# Patient Record
Sex: Female | Born: 1965
Health system: Southern US, Community
[De-identification: ages and names within clinical notes are randomized; demographics above are authoritative.]

## PROBLEM LIST (undated history)

## (undated) DIAGNOSIS — F329 Major depressive disorder, single episode, unspecified: Secondary | ICD-10-CM

## (undated) DIAGNOSIS — K219 Gastro-esophageal reflux disease without esophagitis: Secondary | ICD-10-CM

## (undated) DIAGNOSIS — E785 Hyperlipidemia, unspecified: Secondary | ICD-10-CM

## (undated) DIAGNOSIS — F32A Depression, unspecified: Secondary | ICD-10-CM

## (undated) DIAGNOSIS — I10 Essential (primary) hypertension: Secondary | ICD-10-CM

## (undated) HISTORY — DX: Gastro-esophageal reflux disease without esophagitis: K21.9

## (undated) HISTORY — DX: Depression, unspecified: F32.A

## (undated) HISTORY — PX: BREAST BIOPSY: SHX20

## (undated) HISTORY — DX: Essential (primary) hypertension: I10

## (undated) HISTORY — PX: ESOPHAGOGASTRODUODENOSCOPY: SHX1529

## (undated) HISTORY — DX: Major depressive disorder, single episode, unspecified: F32.9

## (undated) HISTORY — DX: Hyperlipidemia, unspecified: E78.5

---

## 1997-08-03 ENCOUNTER — Encounter: Admission: RE | Admit: 1997-08-03 | Discharge: 1997-08-03 | Payer: Self-pay | Admitting: *Deleted

## 1998-04-03 ENCOUNTER — Emergency Department (HOSPITAL_COMMUNITY): Admission: EM | Admit: 1998-04-03 | Discharge: 1998-04-03 | Payer: Self-pay | Admitting: Emergency Medicine

## 1998-04-03 ENCOUNTER — Encounter: Payer: Self-pay | Admitting: Emergency Medicine

## 1999-11-15 ENCOUNTER — Encounter: Payer: Self-pay | Admitting: Oral Surgery

## 1999-11-15 ENCOUNTER — Ambulatory Visit (HOSPITAL_COMMUNITY): Admission: RE | Admit: 1999-11-15 | Discharge: 1999-11-15 | Payer: Self-pay | Admitting: Oral Surgery

## 2001-08-25 ENCOUNTER — Emergency Department (HOSPITAL_COMMUNITY): Admission: EM | Admit: 2001-08-25 | Discharge: 2001-08-25 | Payer: Self-pay | Admitting: Emergency Medicine

## 2003-03-05 HISTORY — PX: ABDOMINAL HYSTERECTOMY: SHX81

## 2004-01-31 ENCOUNTER — Encounter: Admission: RE | Admit: 2004-01-31 | Discharge: 2004-01-31 | Payer: Self-pay | Admitting: Family Medicine

## 2004-02-07 ENCOUNTER — Other Ambulatory Visit: Admission: RE | Admit: 2004-02-07 | Discharge: 2004-02-07 | Payer: Self-pay | Admitting: *Deleted

## 2004-10-26 ENCOUNTER — Emergency Department: Payer: Self-pay | Admitting: Emergency Medicine

## 2004-11-29 ENCOUNTER — Ambulatory Visit (HOSPITAL_COMMUNITY): Admission: RE | Admit: 2004-11-29 | Discharge: 2004-11-29 | Payer: Self-pay | Admitting: Gastroenterology

## 2005-10-16 ENCOUNTER — Ambulatory Visit: Payer: Self-pay | Admitting: Family Medicine

## 2005-12-30 IMAGING — US US PELVIS COMPLETE MODIFY
1 series · 14 of 25 positions shown · non-contrast
Comparison: none

CLINICAL DATA: Pelvic pain.  Menorrhagia. 
 ULTRASOUND PELVIS ? TRANSABDOMINAL AND TRANSVAGINAL:

[Series 1: unknown · 0.25mm/px · 14 of 61 slices shown]
[im 1/61]
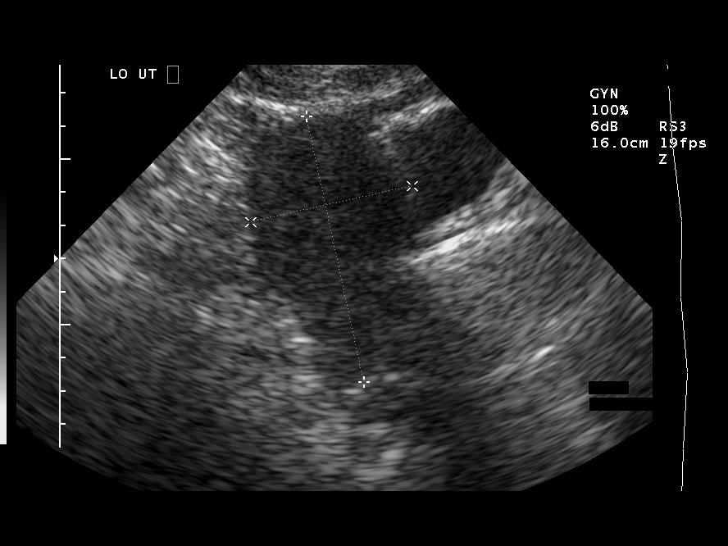
[im 6/61]
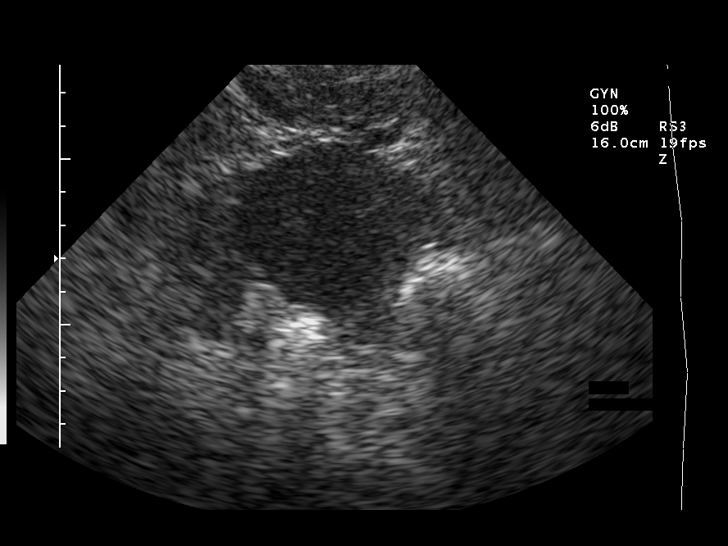
[im 11/61]
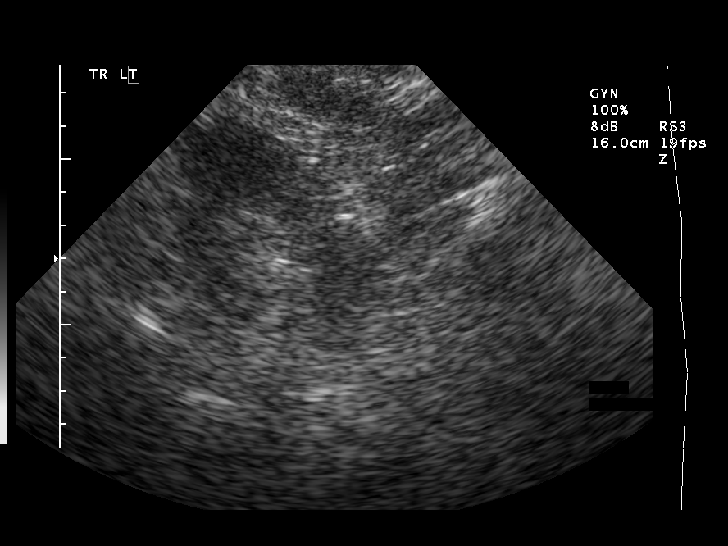
[im 16/61]
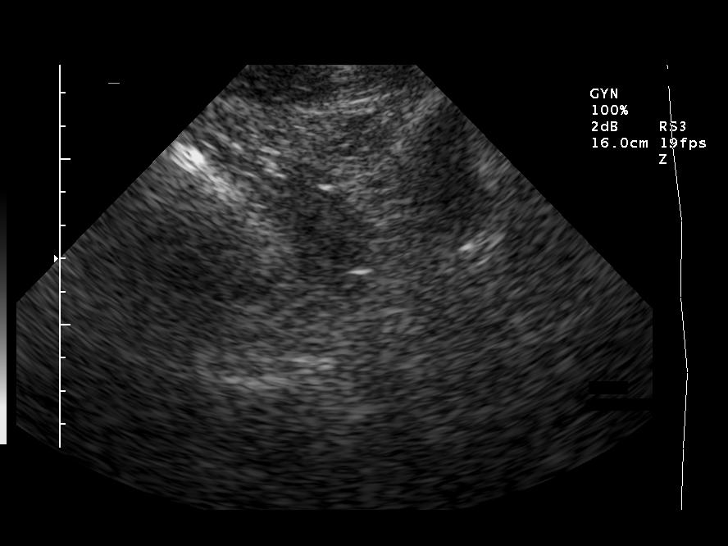
[im 21/61]
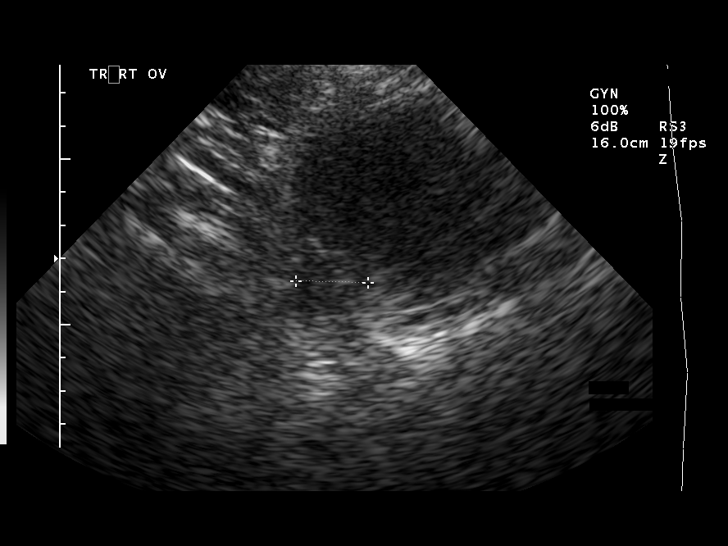
[im 23/61]
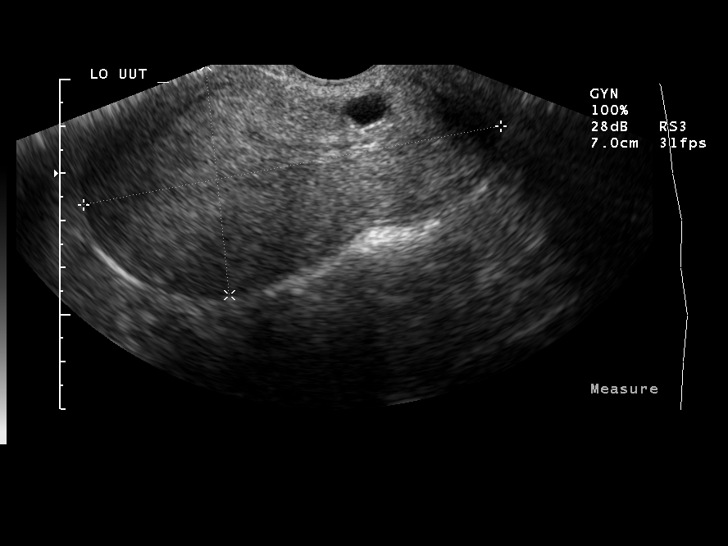
[im 28/61]
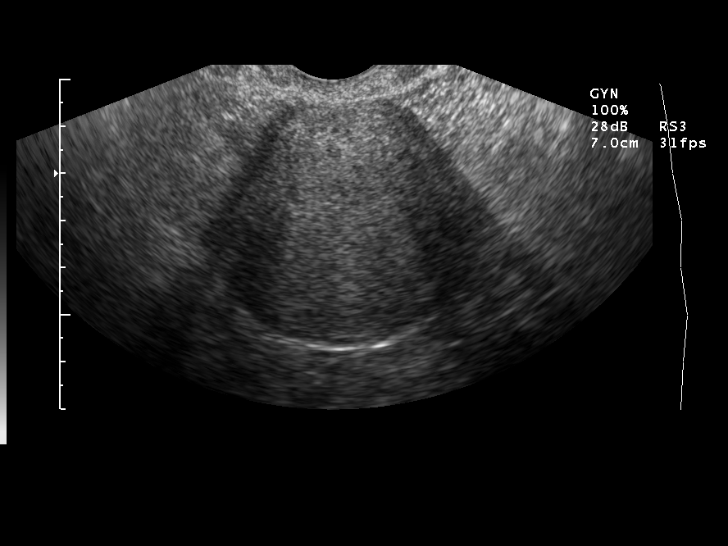
[im 33/61]
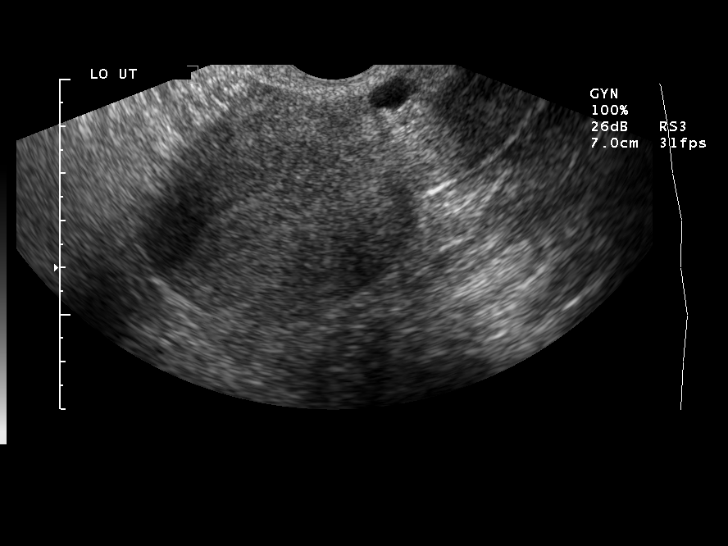
[im 38/61]
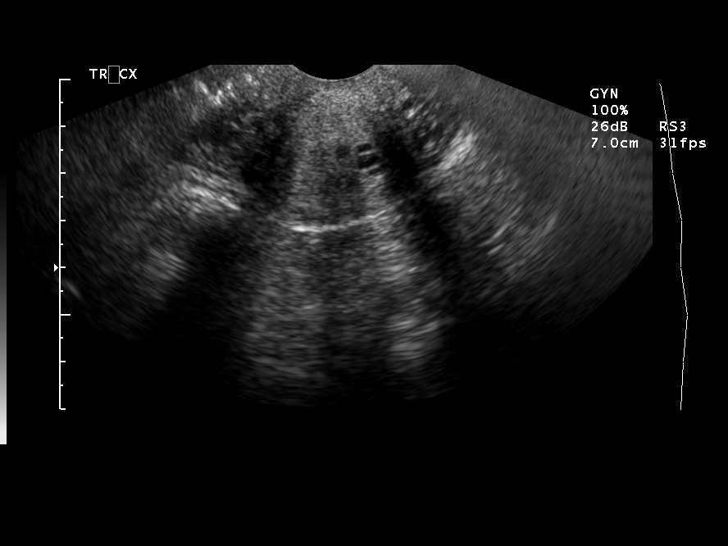
[im 41/61]
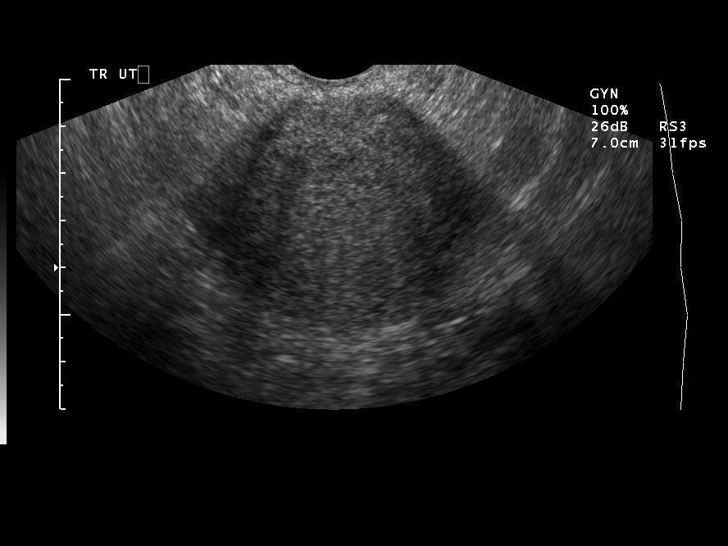
[im 46/61]
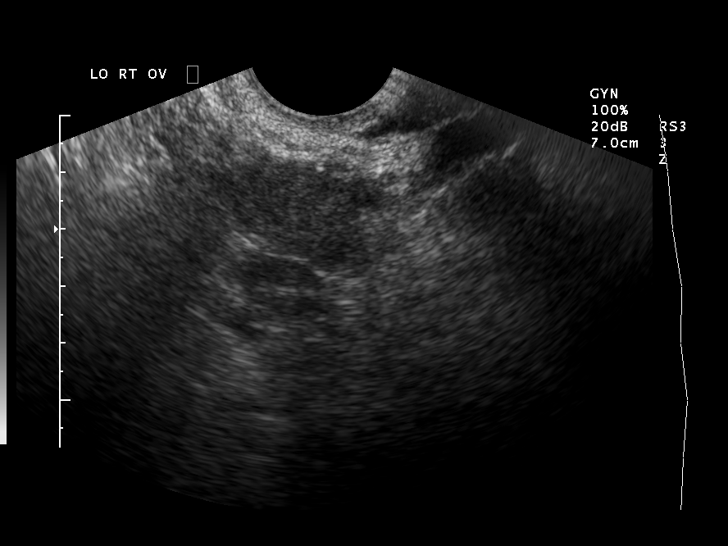
[im 51/61]
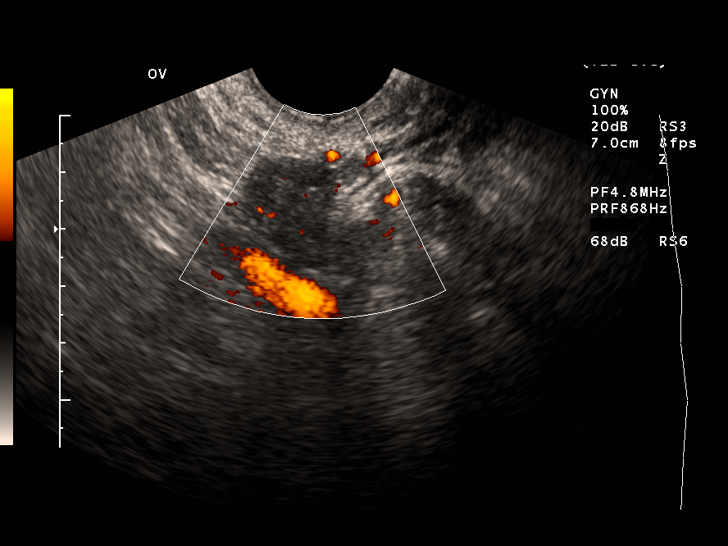
[im 56/61]
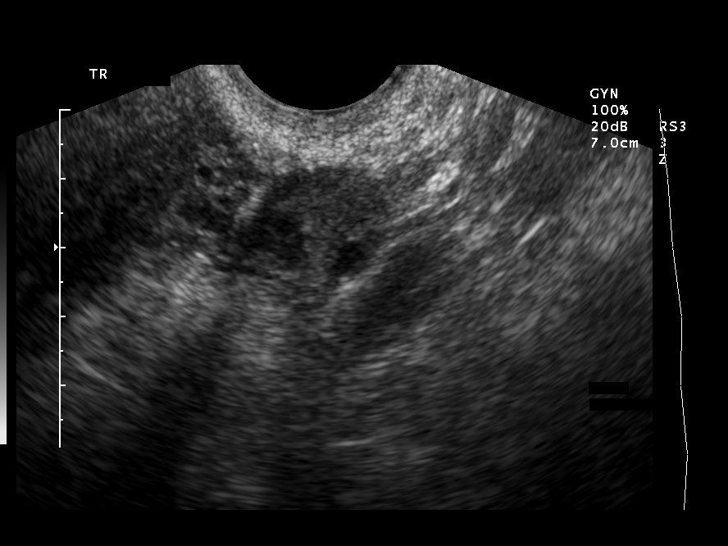
[im 61/61]
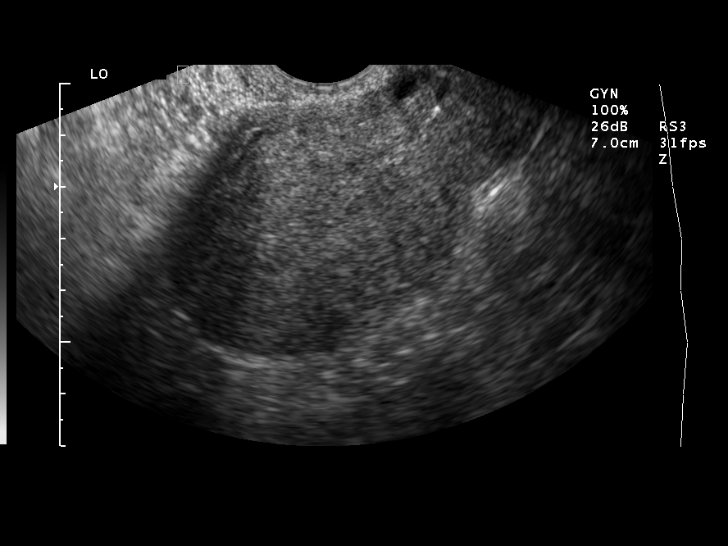

[14 of 25 positions shown; findings below may reference images not displayed]

FINDINGS: The uterus is normal in size measuring 9.4 cm long x 5 cm AP x 6 cm wide with normal fundal endometrial stripe thickness of 12 mm.  The myometrium is slightly heterogeneous although no discrete lesions are seen.  No free fluid is noted.  The bilateral ovaries are sonographically normal with the right measuring 3 cm long x 1.7 cm AP x 2.1 cm wide.  The left 2.5 cm long x 1.8 cm AP x 1.8 cm wide.
IMPRESSION: 1.  Slightly heterogeneous uterine myometrium which could represent slight diffuse fibroid changes.
 2.  Otherwise normal.

## 2006-06-06 ENCOUNTER — Emergency Department (HOSPITAL_COMMUNITY): Admission: EM | Admit: 2006-06-06 | Discharge: 2006-06-06 | Payer: Self-pay | Admitting: Emergency Medicine

## 2006-07-02 ENCOUNTER — Ambulatory Visit: Payer: Self-pay | Admitting: Family Medicine

## 2006-07-02 ENCOUNTER — Other Ambulatory Visit: Admission: RE | Admit: 2006-07-02 | Discharge: 2006-07-02 | Payer: Self-pay | Admitting: Family Medicine

## 2006-08-19 ENCOUNTER — Encounter: Admission: RE | Admit: 2006-08-19 | Discharge: 2006-08-19 | Payer: Self-pay | Admitting: Family Medicine

## 2006-08-28 ENCOUNTER — Ambulatory Visit: Payer: Self-pay | Admitting: Family Medicine

## 2007-05-05 ENCOUNTER — Encounter: Payer: Self-pay | Admitting: Family Medicine

## 2007-07-15 ENCOUNTER — Ambulatory Visit: Payer: Self-pay | Admitting: Family Medicine

## 2007-07-15 DIAGNOSIS — E785 Hyperlipidemia, unspecified: Secondary | ICD-10-CM | POA: Insufficient documentation

## 2007-07-15 DIAGNOSIS — F329 Major depressive disorder, single episode, unspecified: Secondary | ICD-10-CM | POA: Insufficient documentation

## 2007-07-15 DIAGNOSIS — N951 Menopausal and female climacteric states: Secondary | ICD-10-CM | POA: Insufficient documentation

## 2007-07-15 DIAGNOSIS — I10 Essential (primary) hypertension: Secondary | ICD-10-CM | POA: Insufficient documentation

## 2007-07-15 DIAGNOSIS — K219 Gastro-esophageal reflux disease without esophagitis: Secondary | ICD-10-CM | POA: Insufficient documentation

## 2007-07-22 ENCOUNTER — Encounter: Payer: Self-pay | Admitting: Family Medicine

## 2007-09-01 ENCOUNTER — Ambulatory Visit: Payer: Self-pay | Admitting: Family Medicine

## 2007-11-11 ENCOUNTER — Encounter: Payer: Self-pay | Admitting: Family Medicine

## 2007-11-11 ENCOUNTER — Telehealth: Payer: Self-pay | Admitting: Family Medicine

## 2007-12-23 ENCOUNTER — Ambulatory Visit: Payer: Self-pay | Admitting: Family Medicine

## 2007-12-23 DIAGNOSIS — J209 Acute bronchitis, unspecified: Secondary | ICD-10-CM | POA: Insufficient documentation

## 2007-12-23 DIAGNOSIS — M461 Sacroiliitis, not elsewhere classified: Secondary | ICD-10-CM | POA: Insufficient documentation

## 2007-12-23 LAB — CONVERTED CEMR LAB
Bilirubin Urine: NEGATIVE
Glucose, Urine, Semiquant: NEGATIVE
Ketones, urine, test strip: NEGATIVE
Nitrite: NEGATIVE
Protein, U semiquant: NEGATIVE
Specific Gravity, Urine: 1.02
Urobilinogen, UA: 0.2
pH: 7

## 2008-01-04 ENCOUNTER — Ambulatory Visit: Payer: Self-pay | Admitting: Family Medicine

## 2008-01-04 DIAGNOSIS — K5289 Other specified noninfective gastroenteritis and colitis: Secondary | ICD-10-CM | POA: Insufficient documentation

## 2008-01-05 ENCOUNTER — Telehealth: Payer: Self-pay | Admitting: Family Medicine

## 2008-02-28 ENCOUNTER — Ambulatory Visit (HOSPITAL_COMMUNITY): Admission: RE | Admit: 2008-02-28 | Discharge: 2008-02-28 | Payer: Self-pay | Admitting: Emergency Medicine

## 2008-03-01 ENCOUNTER — Ambulatory Visit: Payer: Self-pay | Admitting: Family Medicine

## 2008-03-01 DIAGNOSIS — R55 Syncope and collapse: Secondary | ICD-10-CM | POA: Insufficient documentation

## 2008-03-03 LAB — CONVERTED CEMR LAB
ALT: 16 units/L (ref 0–35)
AST: 22 units/L (ref 0–37)
Albumin: 3.7 g/dL (ref 3.5–5.2)
Alkaline Phosphatase: 45 units/L (ref 39–117)
BUN: 13 mg/dL (ref 6–23)
Basophils Absolute: 0.1 10*3/uL (ref 0.0–0.1)
Basophils Relative: 2.3 % (ref 0.0–3.0)
Bilirubin, Direct: 0.1 mg/dL (ref 0.0–0.3)
CO2: 30 meq/L (ref 19–32)
Calcium: 9.3 mg/dL (ref 8.4–10.5)
Chloride: 102 meq/L (ref 96–112)
Cholesterol: 218 mg/dL (ref 0–200)
Creatinine, Ser: 0.8 mg/dL (ref 0.4–1.2)
Direct LDL: 140 mg/dL
Eosinophils Absolute: 0.2 10*3/uL (ref 0.0–0.7)
Eosinophils Relative: 3.4 % (ref 0.0–5.0)
GFR calc Af Amer: 100 mL/min
GFR calc non Af Amer: 83 mL/min
Glucose, Bld: 86 mg/dL (ref 70–99)
HCT: 40.4 % (ref 36.0–46.0)
HDL: 54.6 mg/dL (ref >39.0)
Hemoglobin: 14.3 g/dL (ref 12.0–15.0)
Lymphocytes Relative: 46.5 % — ABNORMAL HIGH (ref 12.0–46.0)
MCHC: 35.4 g/dL (ref 30.0–36.0)
MCV: 94 fL (ref 78.0–100.0)
Monocytes Absolute: 0.6 10*3/uL (ref 0.1–1.0)
Monocytes Relative: 10.6 % (ref 3.0–12.0)
Neutro Abs: 2 10*3/uL (ref 1.4–7.7)
Neutrophils Relative %: 37.2 % — ABNORMAL LOW (ref 43.0–77.0)
Platelets: 162 10*3/uL (ref 150–400)
Potassium: 3.3 meq/L — ABNORMAL LOW (ref 3.5–5.1)
RBC: 4.3 M/uL (ref 3.87–5.11)
RDW: 13.7 % (ref 11.5–14.6)
Sodium: 140 meq/L (ref 135–145)
TSH: 1.67 microintl units/mL (ref 0.35–5.50)
Total Bilirubin: 1 mg/dL (ref 0.3–1.2)
Total CHOL/HDL Ratio: 4
Total Protein: 6.5 g/dL (ref 6.0–8.3)
Triglycerides: 121 mg/dL (ref 0–149)
VLDL: 24 mg/dL (ref 0–40)
Vitamin B-12: 952 pg/mL — ABNORMAL HIGH (ref 211–911)
WBC: 5.4 10*3/uL (ref 4.5–10.5)

## 2008-03-07 ENCOUNTER — Ambulatory Visit: Payer: Self-pay | Admitting: Family Medicine

## 2008-03-07 DIAGNOSIS — B9789 Other viral agents as the cause of diseases classified elsewhere: Secondary | ICD-10-CM | POA: Insufficient documentation

## 2008-03-07 LAB — CONVERTED CEMR LAB
Bilirubin Urine: NEGATIVE
Glucose, Urine, Semiquant: NEGATIVE
Ketones, urine, test strip: NEGATIVE
Nitrite: NEGATIVE
Protein, U semiquant: NEGATIVE
Specific Gravity, Urine: 1.01
Urobilinogen, UA: 0.2
WBC Urine, dipstick: NEGATIVE
pH: 6.5

## 2008-03-14 LAB — CONVERTED CEMR LAB
CMV IgM: <8 (ref <30.0)
Cytomegalovirus Ab-IgG: 8.6 — ABNORMAL HIGH (ref <0.4)
EBV NA IgG: 7.21 — ABNORMAL HIGH
EBV VCA IgG: 9.92 — ABNORMAL HIGH
EBV VCA IgM: 0.27

## 2008-07-27 ENCOUNTER — Ambulatory Visit: Payer: Self-pay | Admitting: Family Medicine

## 2008-08-03 LAB — CONVERTED CEMR LAB
ALT: 16 units/L (ref 0–35)
AST: 22 units/L (ref 0–37)
Albumin: 3.8 g/dL (ref 3.5–5.2)
Alkaline Phosphatase: 59 units/L (ref 39–117)
BUN: 9 mg/dL (ref 6–23)
Bilirubin, Direct: 0.1 mg/dL (ref 0.0–0.3)
CO2: 30 meq/L (ref 19–32)
Calcium: 8.7 mg/dL (ref 8.4–10.5)
Chloride: 110 meq/L (ref 96–112)
Cholesterol: 221 mg/dL — ABNORMAL HIGH (ref 0–200)
Creatinine, Ser: 0.7 mg/dL (ref 0.4–1.2)
Direct LDL: 162.5 mg/dL
GFR calc non Af Amer: 116.56 mL/min (ref >60)
Glucose, Bld: 87 mg/dL (ref 70–99)
HDL: 55.1 mg/dL (ref >39.00)
Potassium: 3.9 meq/L (ref 3.5–5.1)
Sodium: 143 meq/L (ref 135–145)
Total Bilirubin: 1 mg/dL (ref 0.3–1.2)
Total CHOL/HDL Ratio: 4
Total Protein: 6.6 g/dL (ref 6.0–8.3)
Triglycerides: 76 mg/dL (ref 0.0–149.0)
VLDL: 15.2 mg/dL (ref 0.0–40.0)

## 2008-08-04 ENCOUNTER — Telehealth: Payer: Self-pay | Admitting: Family Medicine

## 2008-09-01 ENCOUNTER — Telehealth: Payer: Self-pay | Admitting: Family Medicine

## 2008-11-30 ENCOUNTER — Ambulatory Visit: Payer: Self-pay | Admitting: Family Medicine

## 2008-12-01 ENCOUNTER — Telehealth: Payer: Self-pay | Admitting: Family Medicine

## 2008-12-16 ENCOUNTER — Telehealth: Payer: Self-pay | Admitting: Family Medicine

## 2009-05-16 ENCOUNTER — Ambulatory Visit: Payer: Self-pay | Admitting: Family Medicine

## 2009-05-19 ENCOUNTER — Telehealth: Payer: Self-pay | Admitting: Family Medicine

## 2009-07-13 ENCOUNTER — Telehealth: Payer: Self-pay | Admitting: Family Medicine

## 2009-07-14 ENCOUNTER — Ambulatory Visit: Payer: Self-pay | Admitting: Family Medicine

## 2009-07-14 DIAGNOSIS — R1013 Epigastric pain: Secondary | ICD-10-CM | POA: Insufficient documentation

## 2009-07-19 ENCOUNTER — Telehealth: Payer: Self-pay | Admitting: Family Medicine

## 2009-07-24 ENCOUNTER — Telehealth: Payer: Self-pay | Admitting: Family Medicine

## 2009-07-25 ENCOUNTER — Telehealth: Payer: Self-pay | Admitting: Gastroenterology

## 2009-07-25 ENCOUNTER — Encounter (INDEPENDENT_AMBULATORY_CARE_PROVIDER_SITE_OTHER): Payer: Self-pay | Admitting: *Deleted

## 2009-07-25 ENCOUNTER — Ambulatory Visit: Payer: Self-pay | Admitting: Gastroenterology

## 2009-07-25 DIAGNOSIS — R112 Nausea with vomiting, unspecified: Secondary | ICD-10-CM | POA: Insufficient documentation

## 2009-07-25 DIAGNOSIS — F17200 Nicotine dependence, unspecified, uncomplicated: Secondary | ICD-10-CM | POA: Insufficient documentation

## 2009-08-02 ENCOUNTER — Telehealth: Payer: Self-pay | Admitting: Gastroenterology

## 2009-08-02 ENCOUNTER — Ambulatory Visit: Payer: Self-pay | Admitting: Gastroenterology

## 2009-08-02 LAB — CONVERTED CEMR LAB: UREASE: NEGATIVE

## 2009-08-03 ENCOUNTER — Telehealth: Payer: Self-pay | Admitting: Family Medicine

## 2009-08-04 ENCOUNTER — Ambulatory Visit (HOSPITAL_COMMUNITY): Admission: RE | Admit: 2009-08-04 | Discharge: 2009-08-04 | Payer: Self-pay | Admitting: Gastroenterology

## 2009-08-04 ENCOUNTER — Encounter: Payer: Self-pay | Admitting: Gastroenterology

## 2009-08-04 LAB — CONVERTED CEMR LAB
ALT: 17 units/L (ref 0–35)
AST: 18 units/L (ref 0–37)
Albumin: 3.7 g/dL (ref 3.5–5.2)
Alkaline Phosphatase: 46 units/L (ref 39–117)
Amylase: 138 units/L — ABNORMAL HIGH (ref 27–131)
BUN: 12 mg/dL (ref 6–23)
Basophils Absolute: 0.2 10*3/uL — ABNORMAL HIGH (ref 0.0–0.1)
Basophils Relative: 3.2 % — ABNORMAL HIGH (ref 0.0–3.0)
Bilirubin, Direct: 0.1 mg/dL (ref 0.0–0.3)
CO2: 27 meq/L (ref 19–32)
Calcium: 9 mg/dL (ref 8.4–10.5)
Chloride: 108 meq/L (ref 96–112)
Creatinine, Ser: 0.7 mg/dL (ref 0.4–1.2)
Eosinophils Absolute: 0.1 10*3/uL (ref 0.0–0.7)
Eosinophils Relative: 2.1 % (ref 0.0–5.0)
Ferritin: 87.2 ng/mL (ref 10.0–291.0)
Folate: 5.8 ng/mL
GFR calc non Af Amer: 110.56 mL/min (ref >60)
Glucose, Bld: 92 mg/dL (ref 70–99)
HCT: 42.4 % (ref 36.0–46.0)
Hemoglobin: 14.7 g/dL (ref 12.0–15.0)
Iron: 128 ug/dL (ref 42–145)
Lipase: 25 units/L (ref 11.0–59.0)
Lymphocytes Relative: 46.7 % — ABNORMAL HIGH (ref 12.0–46.0)
Lymphs Abs: 2.6 10*3/uL (ref 0.7–4.0)
MCHC: 34.6 g/dL (ref 30.0–36.0)
MCV: 93.2 fL (ref 78.0–100.0)
Monocytes Absolute: 0.6 10*3/uL (ref 0.1–1.0)
Monocytes Relative: 11.4 % (ref 3.0–12.0)
Neutro Abs: 2 10*3/uL (ref 1.4–7.7)
Neutrophils Relative %: 36.6 % — ABNORMAL LOW (ref 43.0–77.0)
Platelets: 168 10*3/uL (ref 150.0–400.0)
Potassium: 4 meq/L (ref 3.5–5.1)
RBC: 4.55 M/uL (ref 3.87–5.11)
RDW: 14.8 % — ABNORMAL HIGH (ref 11.5–14.6)
Saturation Ratios: 43.1 % (ref 20.0–50.0)
Sodium: 140 meq/L (ref 135–145)
TSH: 1.98 microintl units/mL (ref 0.35–5.50)
Total Bilirubin: 0.6 mg/dL (ref 0.3–1.2)
Total Protein: 6.4 g/dL (ref 6.0–8.3)
Transferrin: 212.2 mg/dL (ref 212.0–360.0)
Vitamin B-12: 404 pg/mL (ref 211–911)
WBC: 5.6 10*3/uL (ref 4.5–10.5)

## 2009-08-21 ENCOUNTER — Ambulatory Visit: Payer: Self-pay | Admitting: Gastroenterology

## 2009-08-29 ENCOUNTER — Ambulatory Visit: Payer: Self-pay | Admitting: Family Medicine

## 2009-11-13 ENCOUNTER — Telehealth: Payer: Self-pay | Admitting: Gastroenterology

## 2009-11-15 ENCOUNTER — Encounter (INDEPENDENT_AMBULATORY_CARE_PROVIDER_SITE_OTHER): Payer: Self-pay | Admitting: *Deleted

## 2010-01-27 IMAGING — CT CT HEAD W/O CM
1 series · 16 of 30 positions shown, 20 images · non-contrast
Comparison: None.

CLINICAL DATA: Syncopal episode 3 days ago, striking the head.
Continued headaches.

CT HEAD WITHOUT CONTRAST 02/28/2008:
TECHNIQUE: Contiguous axial images were obtained from the base of
the skull through the vertex without contrast.

[Series 2: headseq 4.8 h45s · axial · 0.43mm/px · z∈[+1208,+1338]mm · 16 of 30 slices shown, 20 images]
[im 2/30  brain]
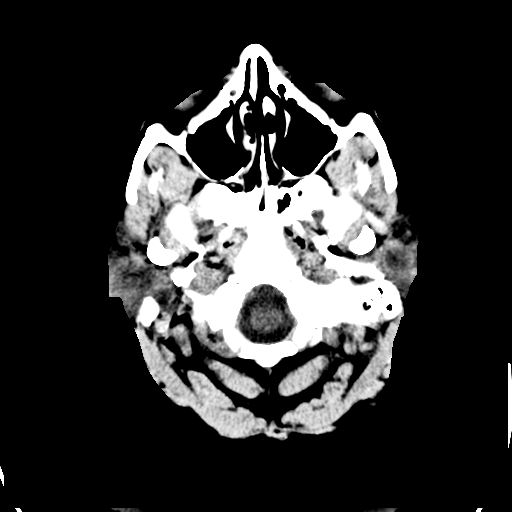
[im 2/30  bone]
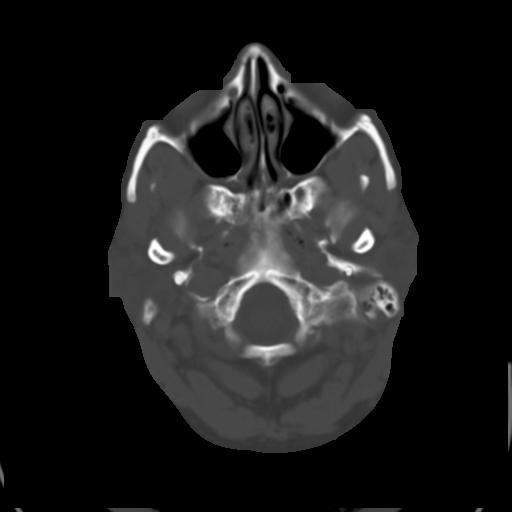
[im 4/30  brain]
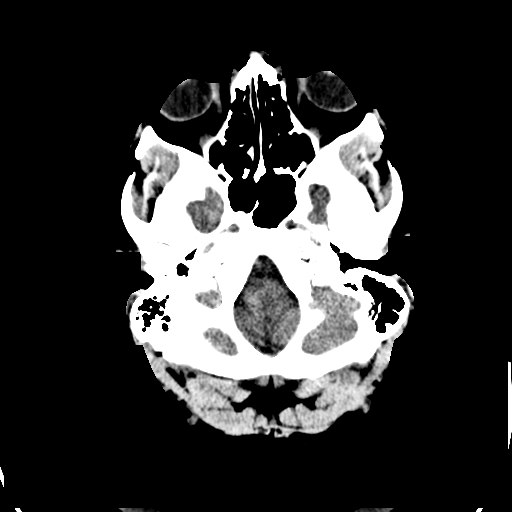
[im 6/30  brain]
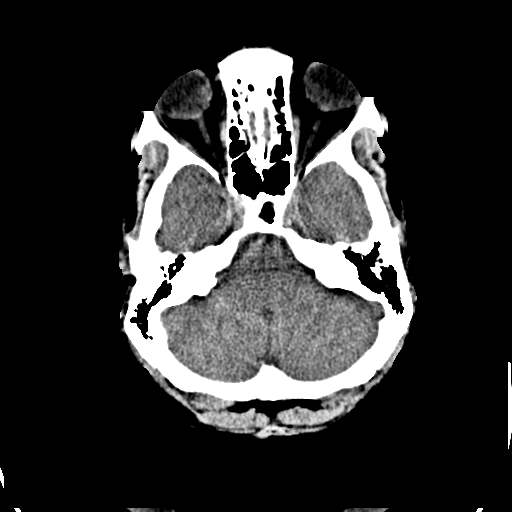
[im 8/30  brain]
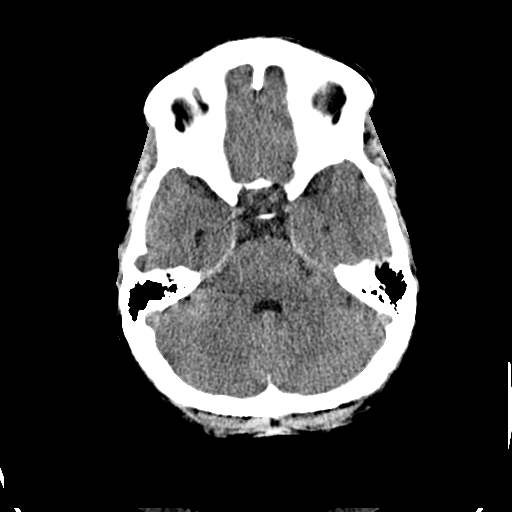
[im 9/30  brain]
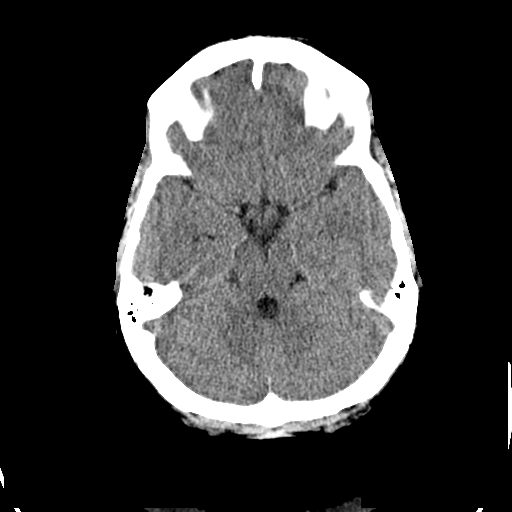
[im 9/30  bone]
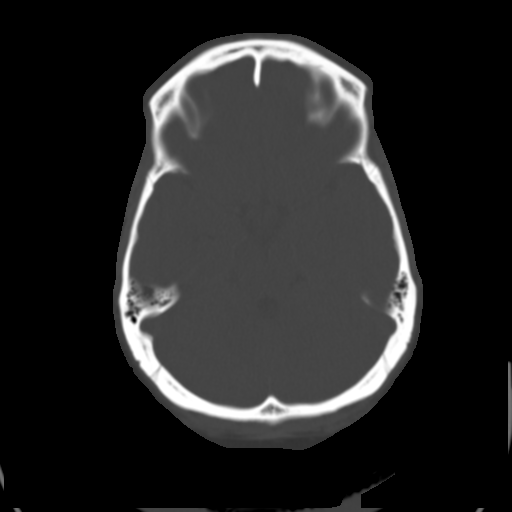
[im 11/30  brain]
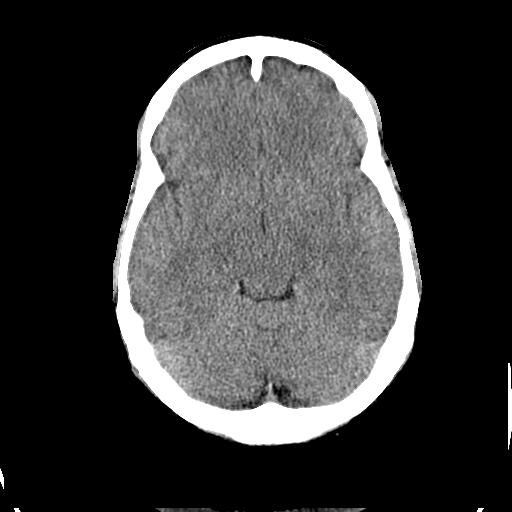
[im 13/30  brain]
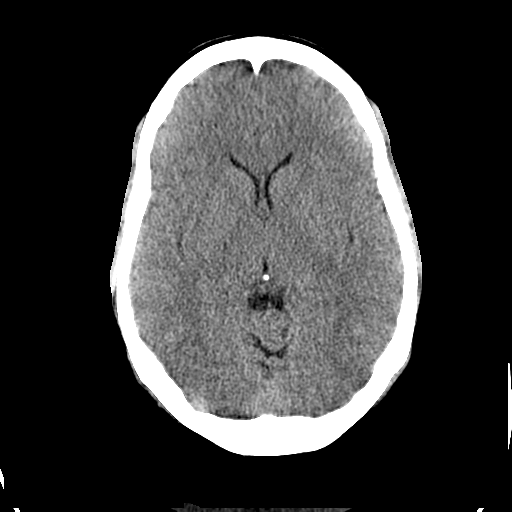
[im 15/30  brain]
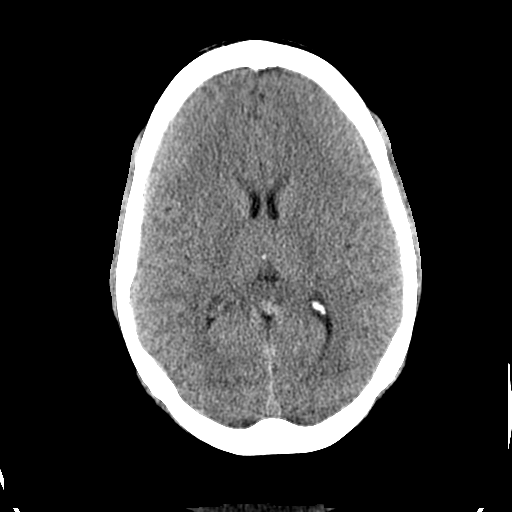
[im 16/30  brain]
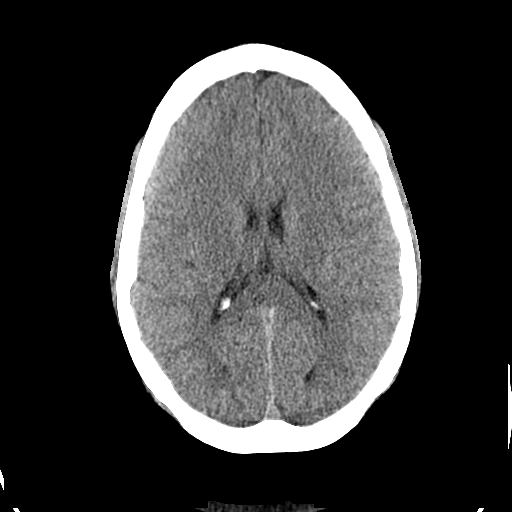
[im 16/30  bone]
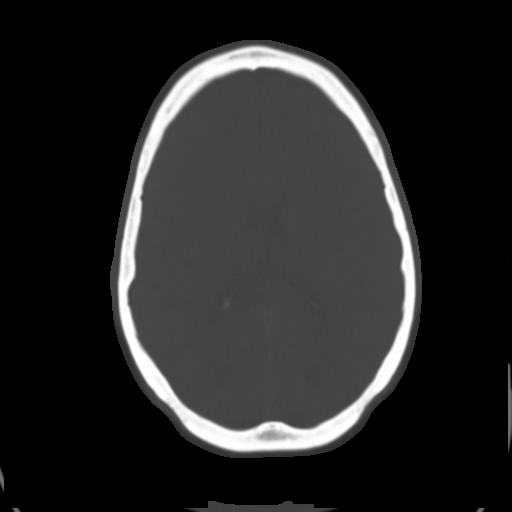
[im 18/30  brain]
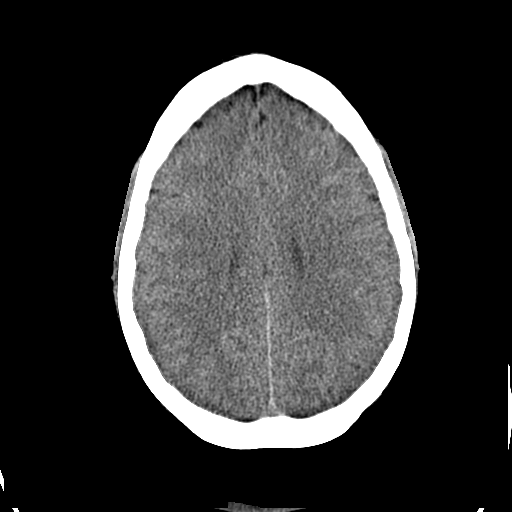
[im 20/30  brain]
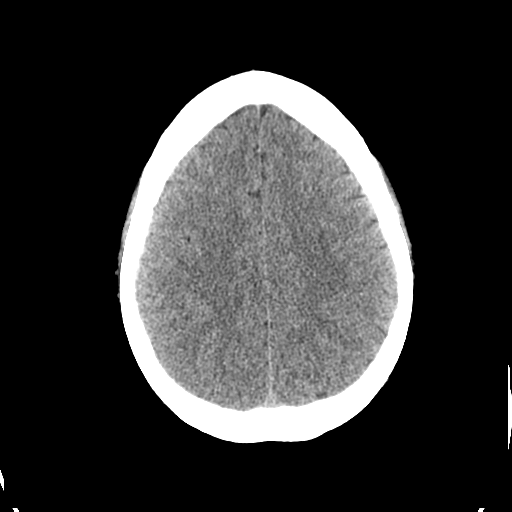
[im 22/30  brain]
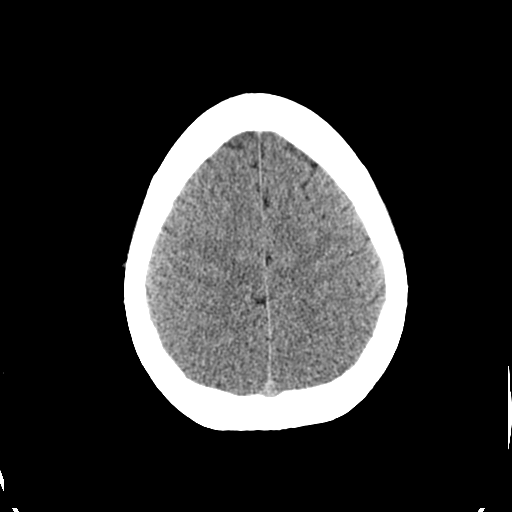
[im 23/30  brain]
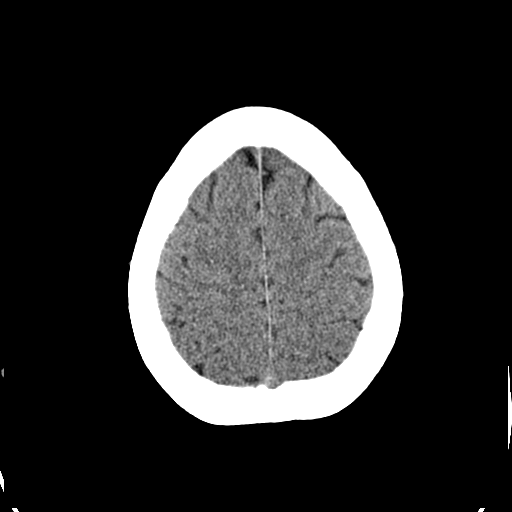
[im 23/30  bone]
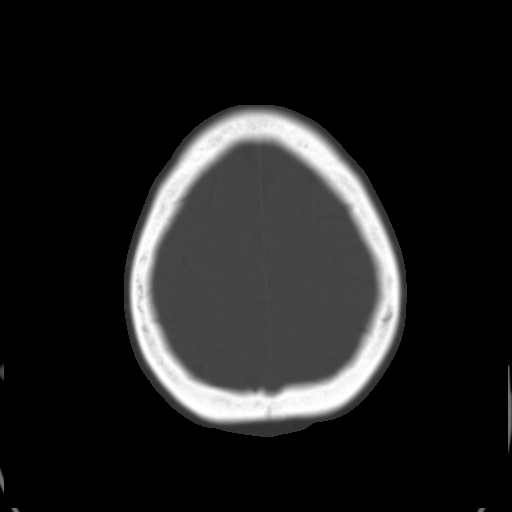
[im 25/30  brain]
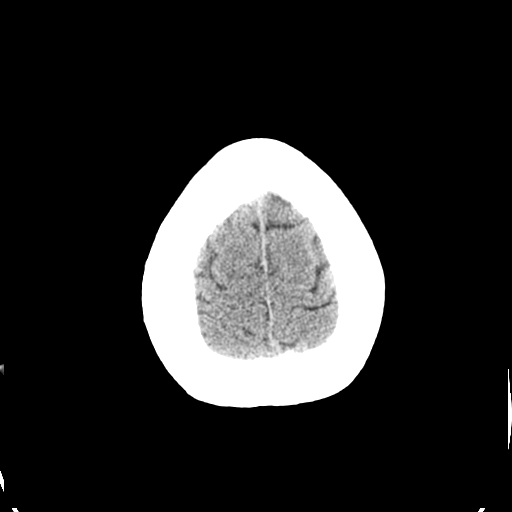
[im 27/30  brain]
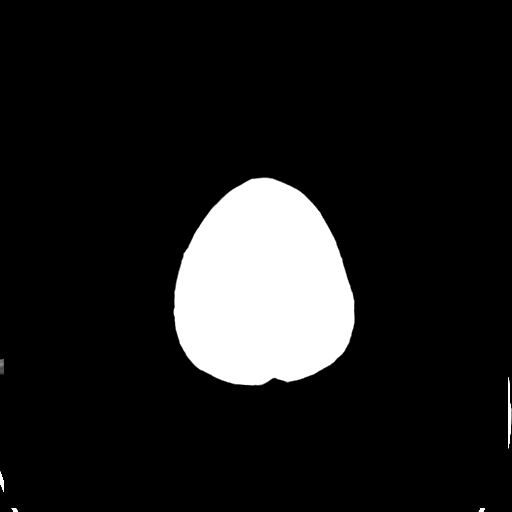
[im 29/30  brain]
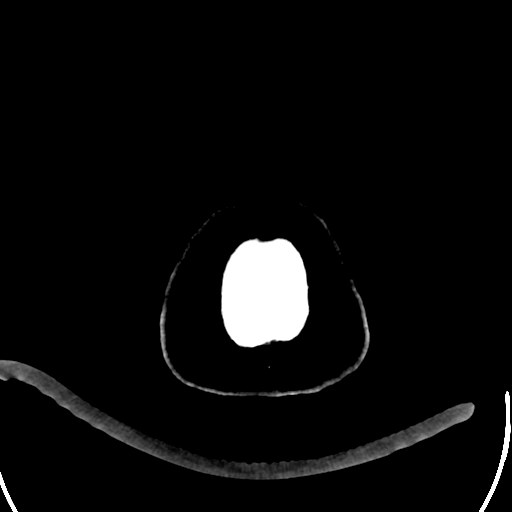

[16 of 30 positions shown; findings below may reference images not displayed]

FINDINGS: Ventricular system normal in size and appearance for age.
No mass lesion.  No midline shift.  No acute hemorrhage or
hematoma.  No extra-axial fluid collections.  No evidence of acute
infarction.  No focal brain parenchymal abnormalities.

No skull fractures or other focal osseous abnormalities involving
the skull.  Visualized paranasal sinuses, mastoid air cells, and
middle ear cavities well-aerated.
IMPRESSION: Normal unenhanced cranial CT.

## 2010-02-08 ENCOUNTER — Ambulatory Visit: Payer: Self-pay | Admitting: Internal Medicine

## 2010-02-08 DIAGNOSIS — M549 Dorsalgia, unspecified: Secondary | ICD-10-CM | POA: Insufficient documentation

## 2010-02-08 DIAGNOSIS — Z87448 Personal history of other diseases of urinary system: Secondary | ICD-10-CM | POA: Insufficient documentation

## 2010-02-08 DIAGNOSIS — M545 Low back pain, unspecified: Secondary | ICD-10-CM | POA: Insufficient documentation

## 2010-02-08 LAB — CONVERTED CEMR LAB
Bilirubin Urine: NEGATIVE
Glucose, Urine, Semiquant: NEGATIVE
Ketones, urine, test strip: NEGATIVE
Nitrite: NEGATIVE
Specific Gravity, Urine: 1.025
Urobilinogen, UA: 0.2
WBC Urine, dipstick: NEGATIVE
pH: 5

## 2010-04-03 ENCOUNTER — Ambulatory Visit
Admission: RE | Admit: 2010-04-03 | Discharge: 2010-04-03 | Payer: Self-pay | Source: Home / Self Care | Attending: Family Medicine | Admitting: Family Medicine

## 2010-04-03 DIAGNOSIS — N318 Other neuromuscular dysfunction of bladder: Secondary | ICD-10-CM | POA: Insufficient documentation

## 2010-04-03 NOTE — Letter (Signed)
Summary: medical records  medical records   Imported By: Kassie Mends 07/22/2007 09:08:18  _____________________________________________________________________  External Attachment:    Type:   Image     Comment:   medical records

## 2010-04-03 NOTE — Progress Notes (Signed)
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  Pt requesting Wed also excused from work, returning on Thursday. Please fax to [phone], inform pt when fax has been completed please Initial call taken by: Sid Falcon LPN,  January 05, 2008 3:14 PM  Follow-up for Phone Call        note faxed, pt aware Follow-up by: Alfred Levins, CMA,  January 05, 2008 5:23 PM

## 2010-04-03 NOTE — Progress Notes (Signed)
  Phone Note Call from Patient   Caller: Patient Call For: Nelwyn Salisbury MD Summary of Call: Pt called said she left Arline Asp a message early this am. Her sinuses have been bothering her x 3 days. She has sinus headaches coming on, ears feel stopped up, coughing up mucus. Not sleeping at night because the drainage from her throat is making her sick. She feels dehydrated. She does not have money to come in. [facility] [address] pt's number [phone] Initial call taken by: Army Fossa CMA,  August 04, 2008 8:10 AM  Follow-up for Phone Call        call in a Zpack. Add Mucinex D two times a day . Follow-up by: Nelwyn Salisbury MD,  August 04, 2008 8:38 AM  Additional Follow-up for Phone Call Additional follow up Details #1::        Phone Call Completed, Rx Called In Additional Follow-up by: Alfred Levins, CMA,  August 04, 2008 12:23 PM    New/Updated Medications: ZITHROMAX Z-PAK 250 MG  TABS (AZITHROMYCIN) Use as directed   Prescriptions: ZITHROMAX Z-PAK 250 MG  TABS (AZITHROMYCIN) Use as directed  #1 x 0   Entered by:   Alfred Levins, CMA   Authorized by:   Nelwyn Salisbury MD   Signed by:   Alfred Levins, CMA on 08/04/2008   Method used:   [facility]  [address] [phone]* (retail)       [address]       [place], [place]  [address]       Ph: [phone]       Fax: [phone]   RxID:   [id number]

## 2010-04-03 NOTE — Progress Notes (Signed)
Summary: Lab reminder  ---- Converted from flag ---- ---- 08/21/2009 11:23 AM, Ashok Cordia RN wrote: Call pt and remind her needs to come by for stool check for h-pylori. ------------------------------  Phone Note Outgoing Call Call back at [facility] Phone [phone]   Call placed by: Harlow Mares CMA Duncan Dull),  November 13, 2009 8:33 AM Call placed to: Patient Summary of Call: Left a message on patients machine to call back.  Initial call taken by: Harlow Mares CMA Duncan Dull),  November 13, 2009 8:33 AM  Follow-up for Phone Call        Left a message on patients machine to call back.  Follow-up by: Harlow Mares CMA Duncan Dull),  November 14, 2009 1:54 PM  Additional Follow-up for Phone Call Additional follow up Details #1::        Left a message on patients machine to call back. I will send the patient a reminder letter.  Additional Follow-up by: Harlow Mares CMA Duncan Dull),  November 15, 2009 12:26 PM

## 2010-04-03 NOTE — Progress Notes (Signed)
Summary: Ultrasound of abd  Phone Note Outgoing Call   Call placed by: Ashok Cordia RN,  August 02, 2009 4:24 PM Summary of Call: Pt needs [place] abd scheduled per Dr. Jarold Motto.  Initial call taken by: Ashok Cordia RN,  August 02, 2009 4:24 PM  Follow-up for Phone Call        Appt sch for 08/04/09 Wl at 8 AM.  Instructions given to pt's daughter Morrie Sheldon. Follow-up by: Ashok Cordia RN,  August 02, 2009 4:31 PM
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
## 2010-04-03 NOTE — Assessment & Plan Note (Signed)
Summary: UTI/dm   Vital Signs:  Patient profile:   45 year old female Weight:      169 pounds Temp:     98.1 degrees F oral BP sitting:   140 / 90  (right arm) Cuff size:   regular  Vitals Entered By: Duard Brady LPN (February 08, 2010 10:53 AM) CC: c/o low back pain ,??uti Is Patient Diabetic? No   Primary Care Provider:  [name], MD  CC:  c/o low back pain  and ??uti.  History of Present Illness: 45 year old patient who is seen today.  Component of the low back pain also describes some mild suprapubic discomfort, urgency, and urinary frequency.  She states she voided 15 times yesterday.  She describes pain in lumbar, area that's fairly constant, with very little in the way of aggravating factors.  She has treated hypertension.  Presently, she is on amlodipine 5 mg daily.  A UA was reviewed and revealed no abnormalities.  The specific gravity was normal at 1025  Allergies: 1)  ! Codeine  Past History:  Past Medical History: Reviewed history from 08/29/2009 and no changes required. GERD Hyperlipidemia Hypertension endometriosis sees Dr. Waynard Reeds for gyn exams mononucleosis 1-10 alcohol abuse pancreatitis May 2011 Depression  Review of Systems       The patient complains of abdominal pain.  The patient denies anorexia, fever, weight loss, weight gain, vision loss, decreased hearing, hoarseness, chest pain, syncope, dyspnea on exertion, peripheral edema, prolonged cough, headaches, hemoptysis, melena, hematochezia, severe indigestion/heartburn, hematuria, incontinence, genital sores, muscle weakness, suspicious skin lesions, transient blindness, difficulty walking, depression, unusual weight change, abnormal bleeding, enlarged lymph nodes, angioedema, and breast masses.    Physical Exam  General:  Well-developed,well-nourished,in no acute distress; alert,appropriate and cooperative throughout examination;  blood  pressure 160/90 Head:  Normocephalic and  atraumatic without obvious abnormalities. No apparent alopecia or balding. Eyes:  No corneal or conjunctival inflammation noted. EOMI. Perrla. Funduscopic exam benign, without hemorrhages, exudates or papilledema. Vision grossly normal. Mouth:  Oral mucosa and oropharynx without lesions or exudates.  Teeth in good repair. Neck:  No deformities, masses, or tenderness noted. Lungs:  Normal respiratory effort, chest expands symmetrically. Lungs are clear to auscultation, no crackles or wheezes. Heart:  Normal rate and regular rhythm. S1 and S2 normal without gallop, murmur, click, rub or other extra sounds. Abdomen:  very mild suprapubic tenderness Msk:  tenderness over the lumbar musculature.  Straight leg testing negative   Impression & Recommendations:  Problem # 1:  BACK PAIN (ICD-724.5)  Her updated medication list for this problem includes:    Tramadol Hcl 50 Mg Tabs (Tramadol hcl) .Marland Kitchen... 1 by mouth q 6-8 hrs as needed    Methocarbamol 500 Mg Tabs (Methocarbamol) ..... One every 6 hours as needed for low back pain  Her updated medication list for this problem includes:    Tramadol Hcl 50 Mg Tabs (Tramadol hcl) .Marland Kitchen... 1 by mouth q 6-8 hrs as needed    Methocarbamol 500 Mg Tabs (Methocarbamol) ..... One every 6 hours as needed for low back pain  Problem # 2:  HYPERTENSION (ICD-401.9)  The following medications were removed from the medication list:    Amlodipine Besylate 5 Mg Tabs (Amlodipine besylate) ..... Once daily Her updated medication list for this problem includes:    Amlodipine Besylate 10 Mg Tabs (Amlodipine besylate) ..... One daily  The following medications were removed from the medication list:    Amlodipine Besylate 5 Mg Tabs (Amlodipine  besylate) ..... Once daily Her updated medication list for this problem includes:    Amlodipine Besylate 10 Mg Tabs (Amlodipine besylate) ..... One daily  Problem # 3:  URINARY FREQUENCY, HX OF (ICD-V13.00) will give her a 3 week  trial of VESIcare  Complete Medication List: 1)  Promethazine Hcl 25 Mg Tabs (Promethazine hcl) .Marland Kitchen.. 1 q 4 hours as needed nausea 2)  Tramadol Hcl 50 Mg Tabs (Tramadol hcl) .Marland Kitchen.. 1 by mouth q 6-8 hrs as needed 3)  Fluoxetine Hcl 40 Mg Caps (Fluoxetine hcl) .... Once daily 4)  Amlodipine Besylate 10 Mg Tabs (Amlodipine besylate) .... One daily 5)  Methocarbamol 500 Mg Tabs (Methocarbamol) .... One every 6 hours as needed for low back pain  Other Orders: UA Dipstick w/o Micro (manual) ([address])  Patient Instructions: 1)  Limit your Sodium (Salt) to less than 2 grams a day(slightly less than 1/2 a teaspoon) to prevent fluid retention, swelling, or worsening of symptoms. 2)  Most patients (90%) with low back pain will improve with time (2-6 weeks). Keep active but avoid activities that are painful. Apply moist heat and/or ice to lower back several times a day. 3)  Please schedule a follow-up appointment in 1 month with Dr. Clent Ridges Prescriptions: METHOCARBAMOL 500 MG TABS (METHOCARBAMOL) one every 6 hours as needed for low back pain  #50 x 0   Entered and Authorized by:   Gordy Savers  MD   Signed by:   Gordy Savers  MD on 02/08/2010   Method used:   [facility]        [facility]  [address] [phone][facility] ([facility]ail)       [address]       [place], [place]  [address]       Ph: [phone]       Fax: [phone]   RxID:   [id number] AMLODIPINE BESYLATE 10 MG TABS (AMLODIPINE BESYLATE) one daily  #90 x 0   Entered and Authorized by:   Gordy Savers  MD   Signed by:   Gordy Savers  MD on 02/08/2010   Method used:   [facility]        [facility]  [address] [phone]* (retail)       [address]       [place], [place]  [address]       Ph: [phone]       Fax: [phone]   RxID:   [id number] TRAMADOL HCL 50 MG TABS (TRAMADOL HCL) 1 by mouth q 6-8 hrs as needed  #45 x 2   Entered and Authorized by:   Gordy Savers  MD   Signed by:   Gordy Savers  MD on 02/08/2010   Method used:   [facility]        [facility]  [address] [phone]* (retail)       [address]       [place], [place]  [address]       Ph: [phone]       Fax: [phone]   RxID:   [id number]    Orders Added: 1)  UA Dipstick w/o Micro (manual) [81002]    Laboratory Results   Urine Tests  Date/Time Received: February 08, 2010 11:08 AM  Date/Time Reported: February 08, 2010 11:08 AM  Routine Urinalysis   Color: yellow Appearance: Clear Glucose: negative   (Normal Range: Negative) Bilirubin: negative   (Normal Range: Negative) Ketone: negative   (Normal Range: Negative) Spec. Gravity: 1.025   (Normal Range: 1.003-1.035) Blood: trace-lysed   (Normal Range: Negative) pH: 5.0   (Normal Range: 5.0-8.0) Protein: trace   (Normal Range: Negative) Urobilinogen: 0.2   (Normal Range: 0-1) Nitrite: negative   (Normal Range: Negative) Leukocyte Esterace: negative   (Normal Range: Negative)

## 2010-04-03 NOTE — Assessment & Plan Note (Signed)
Summary: med reck/jls/PT RESCD//CCM   Vital Signs:  Patient profile:   45 year old female Weight:      169 pounds BMI:     30.05 Temp:     98.6 degrees F oral Pulse rate:   68 / minute BP sitting:   164 / 102  (left arm) Cuff size:   regular  Vitals Entered By: Alfred Levins, CMA (Jul 27, 2008 9:47 AM) CC: renew meds   History of Present Illness: Here to follow up lipids and BP. She has become highly motivated to lose weight and get in shape. Has lost 10 lbs since our last visit. Feels better, has more energy. Would like to stop her lipid and BP meds if possible. Is fasting.  Allergies: 1)  ! Codeine  Past History:  Past Medical History: GERD Hyperlipidemia Hypertension endometriosis sees Dr. Waynard Reeds for gyn exams mononucleosis 1-10  Past Surgical History: Reviewed history from 07/15/2007 and no changes required. Hysterectomy 2005, cervix left intact Breast biopsy, benign Caesarean section 1992  Review of Systems  The patient denies anorexia, fever, weight loss, weight gain, vision loss, decreased hearing, hoarseness, chest pain, syncope, dyspnea on exertion, peripheral edema, prolonged cough, headaches, hemoptysis, abdominal pain, melena, hematochezia, severe indigestion/heartburn, hematuria, incontinence, genital sores, muscle weakness, suspicious skin lesions, transient blindness, difficulty walking, depression, unusual weight change, abnormal bleeding, enlarged lymph nodes, angioedema, breast masses, and testicular masses.    Physical Exam  General:  Well-developed,well-nourished,in no acute distress; alert,appropriate and cooperative throughout examination Neck:  No deformities, masses, or tenderness noted. Lungs:  Normal respiratory effort, chest expands symmetrically. Lungs are clear to auscultation, no crackles or wheezes. Heart:  Normal rate and regular rhythm. S1 and S2 normal without gallop, murmur, click, rub or other extra sounds.   Impression &  Recommendations:  Problem # 1:  HYPERTENSION (ICD-401.9)  The following medications were removed from the medication list:    Lisinopril-hydrochlorothiazide 20-25 Mg Tabs (Lisinopril-hydrochlorothiazide) .Marland Kitchen... Take 1 tab by mouth daily  Problem # 2:  HYPERLIPIDEMIA (ICD-272.4)  The following medications were removed from the medication list:    Simvastatin 40 Mg Tabs (Simvastatin) ..... Once daily  Orders: Venipuncture ([address]) TLB-Lipid Panel (80061-LIPID) TLB-BMP (Basic Metabolic Panel-BMET) (80048-METABOL) TLB-Hepatic/Liver Function Pnl (80076-HEPATIC)  Problem # 3:  GERD (ICD-530.81)  The following medications were removed from the medication list:    Nexium 40 Mg Cpdr (Esomeprazole magnesium) ..... Once daily  Complete Medication List: 1)  Estroven Maximum Strength Tabs (Nutritional supplements) .Marland Kitchen.. 1 by mouth once daily with food 2)  Fluoxetine Hcl 20 Mg Caps (Fluoxetine hcl) .... Once daily  Patient Instructions: 1)  Get labs today. I agreed for her to stop Nexium, potassium, and lisinopril HCTZ for 6 months, then follow up here again.  Prescriptions: FLUOXETINE HCL 20 MG  CAPS (FLUOXETINE HCL) once daily  #30 x 11   Entered and Authorized by:   Nelwyn Salisbury MD   Signed by:   Nelwyn Salisbury MD on 07/27/2008   Method used:   [facility]        [facility]  [address] [phone]* (retail)       [address]       [place], [place]  [address]       Ph: [phone]       Fax: [phone]   RxID:   [id number] ESTROVEN MAXIMUM STRENGTH   TABS (NUTRITIONAL SUPPLEMENTS) 1 by mouth once daily with  food  #30 x 11   Entered and Authorized by:   Nelwyn Salisbury MD   Signed by:   Nelwyn Salisbury MD on 07/27/2008   Method used:   [facility]        [facility]  [address] [phone]* (retail)       [address]       [place], [place]  [address]       Ph: [phone]       Fax: [phone]   RxID:   [id number]

## 2010-04-03 NOTE — Progress Notes (Signed)
Summary: OOW note  Phone Note Call from Patient   Caller: Patient Call For: Nelwyn Salisbury MD Summary of Call: Pt has had diarrhea and vomiting since Tuesday, and is asking if Dr Clent Ridges will Fax an out of work note to her employer?  Will go back to work tomorrow.  Supervisor is Haywood Lasso [phone]  fax [phone]   Initial call taken by: Lynann Beaver CMA,  Jul 13, 2009 11:08 AM  Follow-up for Phone Call        No we cannot certify that we have been taking care of someone when we have not. She would need an OV to get a worknote Follow-up by: Nelwyn Salisbury MD,  Jul 13, 2009 11:51 AM  Additional Follow-up for Phone Call Additional follow up Details #1::        Pt notified. Additional Follow-up by: Lynann Beaver CMA,  Jul 13, 2009 12:05 PM

## 2010-04-03 NOTE — Progress Notes (Signed)
Summary: meds  Phone Note Call from Patient Call back at Home Phone [phone]   Caller: Patient Call For: Dr. Jarold Motto Reason for Call: Talk to Nurse Summary of Call: states there should have been 2 new medications called into her pharmacy, but neither is there... [facility] [address] Initial call taken by: Vallarie Mare,  Jul 25, 2009 3:02 PM  Follow-up for Phone Call        Rx have been sent to pharmacy.  Pt notified. Follow-up by: Ashok Cordia RN,  Jul 25, 2009 3:20 PM

## 2010-04-03 NOTE — Medication Information (Signed)
Summary: Approval for Nexium/[employer]  Approval for Nexium/[employer]   Imported By: Maryln Gottron 11/13/2007 14:04:25  _____________________________________________________________________  External Attachment:    Type:   Image     Comment:   External Document

## 2010-04-03 NOTE — Procedures (Signed)
Summary: Upper Endoscopy  Patient: Kristine [name] Note: All result statuses are Final unless otherwise noted.  Tests: (1) Upper Endoscopy (EGD)   EGD Upper Endoscopy       DONE     [facility]     [address]     [place], [place]  [address]           ENDOSCOPY PROCEDURE REPORT           PATIENT:  Kristine [name], Kristine [name]  MR#:  [id number]     BIRTHDATE:  30-Mar-1965, 43 yrs. old  GENDER:  female           ENDOSCOPIST:  Vania Rea. Jarold Motto, MD, [facility]     Referred by:           PROCEDURE DATE:  08/02/2009     PROCEDURE:  EGD with biopsy for H. pylori     ASA CLASS:  Class II     INDICATIONS:  abdominal pain resolving pancreatitis suspected.           MEDICATIONS:   Fentanyl 100 mcg IV, Versed 10 mg IV     TOPICAL ANESTHETIC:  Exactacain Spray           DESCRIPTION OF PROCEDURE:   After the risks benefits and     alternatives of the procedure were thoroughly explained, informed     consent was obtained.  The LB [id number] endoscope was     introduced through the mouth and advanced to the second portion of     the duodenum, without limitations.  The instrument was slowly     withdrawn as the mucosa was fully examined.     <<PROCEDUREIMAGES>>           The upper, middle, and distal third of the esophagus were     carefully inspected and no abnormalities were noted. The z-line     was well seen at the GEJ. The endoscope was pushed into the fundus     which was normal including a retroflexed view. The antrum,gastric     body, first and second part of the duodenum were unremarkable. CLO     and random gastric biopsies to r/o H.pylori done.    Retroflexed     views revealed no abnormalities.    The scope was then withdrawn     from the patient and the procedure completed.           COMPLICATIONS:  None           ENDOSCOPIC IMPRESSION:     1) Normal EGD     1.R/O H.PYLORI     2.R/O GALLSTONES     RECOMMENDATIONS:     1) continue current meds     1.STOP ALCOHOL USE     2.ULTRASOUND TO BE SCHEDULED BY DONNA.           REPEAT EXAM:  No           ______________________________     Vania Rea. Jarold Motto, MD, Clement[facility] Graham           CC:  Nelwyn Salisbury, MD           n.     Rosalie DoctorMarland Kitchen   Vania Rea. Aven Christen at 08/02/2009 04:03 PM           Briant Cedar, [id number]  Note: An exclamation mark (!) indicates a result that was not dispersed into the flowsheet. Document Creation Date: 08/02/2009 4:04 PM _______________________________________________________________________  (1) Order  result status: Final Collection or observation date-time: 08/02/2009 15:57 Requested date-time:  Receipt date-time:  Reported date-time:  Referring Physician:   Ordering Physician: [name] [phone]) Specimen Source:  Source: Launa Grill Order Number: [id number] Lab site:

## 2010-04-03 NOTE — Assessment & Plan Note (Signed)
Summary: MED CHECK/REFILL/CJR   Vital Signs:  Patient profile:   45 year old female Weight:      162 pounds BMI:     28.80 BP sitting:   160 / 102  (left arm) Cuff size:   regular  Vitals Entered By: Raechel Ache, RN (August 29, 2009 8:41 AM) CC: Med check- not taking BP med because of low potassium.   History of Present Illness: Here to follow up on HTN and depression. She is doing well now and feels great. The Fluoxetine helps a lot, but she would like to increase the dose if possible. She had stopped her Lisinopril HCT about 7 months ago, unbeknownst to me because she was concerned about developing a low potassium level. Of course her BP has been high ever since. She would like to start on a different type of med now. Her abdominal pain has completely resolved after taking a Prevpac for H. pylori from Dr. Jarold Motto. She has no nausea, and her appetite is fine.   Allergies: 1)  ! Codeine  Past History:  Past Medical History: GERD Hyperlipidemia Hypertension endometriosis sees Dr. Waynard Reeds for gyn exams mononucleosis 1-10 alcohol abuse pancreatitis May 2011 Depression  Past Surgical History: Hysterectomy 2005, cervix left intact Breast biopsy, benign Caesarean section 1992 EGD 08-02-09 per Dr. Jarold Motto, normal except positive for H. pylori  Review of Systems  The patient denies anorexia, fever, weight loss, weight gain, vision loss, decreased hearing, hoarseness, chest pain, syncope, dyspnea on exertion, peripheral edema, prolonged cough, headaches, hemoptysis, abdominal pain, melena, hematochezia, severe indigestion/heartburn, hematuria, incontinence, genital sores, muscle weakness, suspicious skin lesions, transient blindness, difficulty walking, depression, unusual weight change, abnormal bleeding, enlarged lymph nodes, angioedema, breast masses, and testicular masses.    Physical Exam  General:  Well-developed,well-nourished,in no acute distress; alert,appropriate  and cooperative throughout examination Neck:  No deformities, masses, or tenderness noted. Lungs:  Normal respiratory effort, chest expands symmetrically. Lungs are clear to auscultation, no crackles or wheezes. Heart:  Normal rate and regular rhythm. S1 and S2 normal without gallop, murmur, click, rub or other extra sounds. Abdomen:  Bowel sounds positive,abdomen soft and non-tender without masses, organomegaly or hernias noted. Psych:  Cognition and judgment appear intact. Alert and cooperative with normal attention span and concentration. No apparent delusions, illusions, hallucinations   Impression & Recommendations:  Problem # 1:  ABDOMINAL PAIN, EPIGASTRIC (ICD-789.06)  Problem # 2:  DEPRESSION (ICD-311)  The following medications were removed from the medication list:    Fluoxetine Hcl 20 Mg Caps (Fluoxetine hcl) ..... Once daily Her updated medication list for this problem includes:    Fluoxetine Hcl 40 Mg Caps (Fluoxetine hcl) ..... Once daily  Problem # 3:  HYPERTENSION (ICD-401.9)  The following medications were removed from the medication list:    Lisinopril-hydrochlorothiazide 20-25 Mg Tabs (Lisinopril-hydrochlorothiazide) .Marland Kitchen... 1 once daily Her updated medication list for this problem includes:    Amlodipine Besylate 5 Mg Tabs (Amlodipine besylate) ..... Once daily  Problem # 4:  GERD (ICD-530.81)  The following medications were removed from the medication list:    Nexium 40 Mg Cpdr (Esomeprazole magnesium) .Marland Kitchen..Marland Kitchen Two times a day    Carafate 1 Gm/58ml Susp (Sucralfate) .Marland Kitchen... 1 gm before meals and at bedtime Her updated medication list for this problem includes:    Prevpac Misc (Amoxicill-clarithro-lansopraz) .Marland Kitchen... Take as directed  Complete Medication List: 1)  Promethazine Hcl 25 Mg Tabs (Promethazine hcl) .Marland Kitchen.. 1 q 4 hours as needed nausea 2)  Tramadol Hcl 50 Mg Tabs (Tramadol hcl) .Marland Kitchen.. 1 by mouth q 6-8 hrs as needed 3)  Prevpac Misc (Amoxicill-clarithro-lansopraz)  .... Take as directed 4)  Fluoxetine Hcl 40 Mg Caps (Fluoxetine hcl) .... Once daily 5)  Amlodipine Besylate 5 Mg Tabs (Amlodipine besylate) .... Once daily  Patient Instructions: 1)  Try Amlodipine and increase Fluoxetine to 40 mg once daily . 2)  Please schedule a follow-up appointment in 1 month.  Prescriptions: AMLODIPINE BESYLATE 5 MG TABS (AMLODIPINE BESYLATE) once daily  #30 x 11   Entered and Authorized by:   Nelwyn Salisbury MD   Signed by:   Nelwyn Salisbury MD on 08/29/2009   Method used:   [facility] [address] [phone]* (retail)       [address]       [place], [place]  [address]       Ph: [phone]       Fax: [phone]   RxID:   [id number] FLUOXETINE HCL 40 MG CAPS (FLUOXETINE HCL) once daily  #30 x 11   Entered and Authorized by:   Nelwyn Salisbury MD   Signed by:   Nelwyn Salisbury MD on 08/29/2009   Method used:   [facility]        [facility]  [address] [phone]* (retail)       [address]       [place], [place]  [address]       Ph: [phone]       Fax: [phone]   RxID:   [id number]

## 2010-04-03 NOTE — Progress Notes (Signed)
Summary: painful knot  Phone Note Call from Patient Call back at [facility] Phone [phone]   Summary of Call: Knot size 50cent size, very sore and hurts when she moves or sits from injection she got in her hip her a week ago.  No pus.  No redness.   No streaks.   Not visible or protruding, but she feels the pain & soreness of it & feels it under her skin.  Wants Dr. Claris Che assurance that this is okay.  Offered ov check, but she doesn't want to have to pay copay or ov.  She will watch it & call back as needed & if not better by Caleen Essex will come to get it checked.   Initial call taken by: Rudy Jew, RN,  Jul 19, 2009 10:35 AM  Follow-up for Phone Call        agreed Follow-up by: Nelwyn Salisbury MD,  Jul 19, 2009 10:37 AM

## 2010-04-03 NOTE — Progress Notes (Signed)
Summary: BP questions  Phone Note Call from Patient   Caller: Spouse Call For: Nelwyn Salisbury MD Summary of Call: Pt's husband called to ask if a BP of 94/64 is dangerous>  Pt. started back on her BP meds 3 days ago, and began to feel dizzy.  Instructed husband that she should not be making decisions on her own meds.  She should follow Dr. Claris Che medication regimen, and if she has abnormal BP readings.........make an OV.   Initial call taken by: Lynann Beaver CMA,  September 01, 2008 9:26 AM  Follow-up for Phone Call        agreed Follow-up by: Nelwyn Salisbury MD,  September 01, 2008 11:37 AM

## 2010-04-03 NOTE — Assessment & Plan Note (Signed)
Summary: acute syncopal episodes since last office visit/dm   Vital Signs:  Patient Profile:   45 Years Old Female Height:     63 inches Weight:      178 pounds Temp:     98.5 degrees F oral BP sitting:   136 / 84  (left arm) Cuff size:   large  Vitals Entered By: Alfred Levins, CMA (March 07, 2008 3:13 PM)                 Chief Complaint:  h/a, stomach cramps, nausea, and diarrhea.  History of Present Illness: I saw her on 03-01-08 to follow up dizziness after a syncopal spell which occurred on 02-24-08. Workup was negative with a normal head CT. Labs were unremarkable except for a potassium of 3.3 here on 03-01-08. We started her on potassium supplements after that. Unfortunately she still feels bad with weakness, muscle cramps, nausea, frequent stools, chills, HA, and some dizziness. No more syncopal spells. No neck pains or fever or cough. Her daughter drove her today.     Current Allergies: ! CODEINE  Past Medical History:    Reviewed history from 07/15/2007 and no changes required:       GERD       Hyperlipidemia       Hypertension       endometriosis       sees Dr. Waynard Reeds for gyn exams  Past Surgical History:    Reviewed history from 07/15/2007 and no changes required:       Hysterectomy 2005, cervix left intact       Breast biopsy, benign       Caesarean section 1992     Review of Systems  The patient denies anorexia, fever, weight loss, weight gain, vision loss, decreased hearing, hoarseness, chest pain, syncope, dyspnea on exertion, peripheral edema, prolonged cough, headaches, hemoptysis, melena, hematochezia, severe indigestion/heartburn, hematuria, incontinence, genital sores, muscle weakness, suspicious skin lesions, transient blindness, difficulty walking, depression, unusual weight change, abnormal bleeding, enlarged lymph nodes, angioedema, breast masses, and testicular masses.     Physical Exam  General:     appears ill but alert Head:    Normocephalic and atraumatic without obvious abnormalities. No apparent alopecia or balding. Eyes:     No corneal or conjunctival inflammation noted. EOMI. Perrla. Funduscopic exam benign, without hemorrhages, exudates or papilledema. Vision grossly normal. Ears:     External ear exam shows no significant lesions or deformities.  Otoscopic examination reveals clear canals, tympanic membranes are intact bilaterally without bulging, retraction, inflammation or discharge. Hearing is grossly normal bilaterally. Nose:     External nasal examination shows no deformity or inflammation. Nasal mucosa are pink and moist without lesions or exudates. Mouth:     Oral mucosa and oropharynx without lesions or exudates.  Teeth in good repair. Neck:     No deformities, masses, or tenderness noted. Supple with full ROM Lungs:     Normal respiratory effort, chest expands symmetrically. Lungs are clear to auscultation, no crackles or wheezes. Heart:     Normal rate and regular rhythm. S1 and S2 normal without gallop, murmur, click, rub or other extra sounds. Abdomen:     Bowel sounds positive,abdomen soft and non-tender without masses, organomegaly or hernias noted. Msk:     No deformity or scoliosis noted of thoracic or lumbar spine.   Pulses:     R and L carotid,radial,femoral,dorsalis pedis and posterior tibial pulses are full and equal bilaterally Extremities:  No clubbing, cyanosis, edema, or deformity noted with normal full range of motion of all joints.   Neurologic:     No cranial nerve deficits noted. Station and gait are normal. Plantar reflexes are down-going bilaterally. DTRs are symmetrical throughout. Sensory, motor and coordinative functions appear intact. Skin:     Intact without suspicious lesions or rashes    Impression & Recommendations:  Problem # 1:  VIRAL INFECTION (ICD-079.99)  Orders: Venipuncture ([address]) T- * Misc. Laboratory test [phone]) UA Dipstick w/o Micro (manual)  (81002)   Complete Medication List: 1)  Estroven Maximum Strength Tabs (Nutritional supplements) .Marland Kitchen.. 1 by mouth once daily with food 2)  Lisinopril-hydrochlorothiazide 20-25 Mg Tabs (Lisinopril-hydrochlorothiazide) .... Take 1 tab by mouth daily 3)  Fluoxetine Hcl 20 Mg Caps (Fluoxetine hcl) .... Once daily 4)  Simvastatin 40 Mg Tabs (Simvastatin) .... Once daily 5)  Nexium 40 Mg Cpdr (Esomeprazole magnesium) .... Once daily 6)  Promethazine Hcl 25 Mg Tabs (Promethazine hcl) .Marland Kitchen.. 1 every 4 hours as needed nausea 7)  Tramadol Hcl 50 Mg Tabs (Tramadol hcl) .Marland Kitchen.. 1 by mouth every 6 hours 8)  Klor-con M10 10 Meq Cr-tabs (Potassium chloride crys cr) .Marland Kitchen.. 1 by mouth once daily   Patient Instructions: 1)  Increase potassium pills to two times a day , rest, drink fluids. Off work all this week. Test labs for EBV and CMV.    ] Laboratory Results   Urine Tests  Date/Time Recieved: March 07, 2008 4:23 PM Date/Time Reported: March 07, 2008 4:23 PM  Routine Urinalysis   Color: yellow Appearance: Clear Glucose: negative   (Normal Range: Negative) Bilirubin: negative   (Normal Range: Negative) Ketone: negative   (Normal Range: Negative) Spec. Gravity: 1.010   (Normal Range: 1.003-1.035) Blood: trace-intact   (Normal Range: Negative) pH: 6.5   (Normal Range: 5.0-8.0) Protein: negative   (Normal Range: Negative) Urobilinogen: 0.2   (Normal Range: 0-1) Nitrite: negative   (Normal Range: Negative) Leukocyte Esterace: negative   (Normal Range: Negative)    Comments: Alfred Levins, CMA  March 07, 2008 4:23 PM

## 2010-04-03 NOTE — Letter (Signed)
Summary: EGD Instructions  [facility]  [address] [place], [place] [address]   Phone: [phone]  Fax: [phone]       Kristine [name]    [birth year]    MRN: [id number]       Procedure Day Dorna Bloom: Wednesday, 08/02/09     Arrival Time: 2:30     Procedure Time: 3:30     Location of Procedure:                    _X  _ [facility] (4th Floor)    PREPARATION FOR ENDOSCOPY   On 08/02/09  THE DAY OF THE PROCEDURE:  1.   No solid foods, milk or milk products are allowed after midnight the night before your procedure.  2.   Do not drink anything colored red or purple.  Avoid juices with pulp.  No orange juice.  3.  You may drink clear liquids until 1:30, which is 2 hours before your procedure.                                                                                                CLEAR LIQUIDS INCLUDE: Water Jello Ice Popsicles Tea (sugar ok, no milk/cream) Powdered fruit flavored drinks Coffee (sugar ok, no milk/cream) Gatorade Juice: apple, white grape, white cranberry  Lemonade Clear bullion, consomm, broth Carbonated beverages (any kind) Strained chicken noodle soup Hard Candy   MEDICATION INSTRUCTIONS  Unless otherwise instructed, you should take regular prescription medications with a small sip of water as early as possible the morning of your procedure.                     OTHER INSTRUCTIONS  You will need a responsible adult at least 47 years of age to accompany you and drive you home.   This person must remain in the waiting room during your procedure.  Wear loose fitting clothing that is easily removed.  Leave jewelry and other valuables at home.  However, you may wish to bring a book to read or an iPod/MP3 player to listen to music as you wait for your procedure to start.  Remove all body piercing jewelry and leave at home.  Total time from sign-in until discharge is approximately 2-3 hours.  You  should go home directly after your procedure and rest.  You can resume normal activities the day after your procedure.  The day of your procedure you should not:   Drive   Make legal decisions   Operate machinery   Drink alcohol   Return to work  You will receive specific instructions about eating, activities and medications before you leave.    The above instructions have been reviewed and explained to me by   _______________________    I fully understand and can verbalize these instructions _____________________________ Date _________

## 2010-04-03 NOTE — Progress Notes (Signed)
Summary: wants to go back to work  Phone Note Call from Patient   Summary of Call: Patient is feeling alot better. Patient would like to be released to go back to work tomorrow 12/02/2008. Patient wants the release to be faxed to  Attention [employer] [phone]. Patient can be reached at [phone]. Initial call taken by: Darra Lis RMA,  December 01, 2008 1:59 PM  Follow-up for Phone Call        okay please do this Follow-up by: Nelwyn Salisbury MD,  December 02, 2008 8:50 AM  Additional Follow-up for Phone Call Additional follow up Details #1::        pt said it was to late cause she works early morning.  She said she would be ok till monday Additional Follow-up by: Alfred Levins, CMA,  December 02, 2008 10:43 AM

## 2010-04-03 NOTE — Progress Notes (Signed)
Summary: Request RX  Phone Note Call from Patient Call back at [facility] Phone [phone]   Caller: Patient Summary of Call: Had endoscopy done yesterday BP was elevated  was advised to resume lisinopril needs [facility] [address]  Initial call taken by: Kathrynn Speed CMA,  August 03, 2009 8:14 AM  Follow-up for Phone Call        call in Lisinopril HCT 20/25 once daily , #30 with 11 rf. She should see me in one month.  Follow-up by: Nelwyn Salisbury MD,  August 03, 2009 8:43 AM    New/Updated Medications: LISINOPRIL-HYDROCHLOROTHIAZIDE 20-25 MG TABS (LISINOPRIL-HYDROCHLOROTHIAZIDE) 1 once daily Prescriptions: LISINOPRIL-HYDROCHLOROTHIAZIDE 20-25 MG TABS (LISINOPRIL-HYDROCHLOROTHIAZIDE) 1 once daily  #30 x 11   Entered by:   Raechel Ache, RN   Authorized by:   Nelwyn Salisbury MD   Signed by:   Raechel Ache, RN on 08/03/2009   Method used:   [facility]        [facility]  [address] [phone]* (retail)       [address]       [place], [place]  [address]       Ph: [phone]       Fax: [phone]   RxID:   [id number]

## 2010-04-03 NOTE — Letter (Signed)
Summary: Patient [facility] Biopsy Results  [facility]  [address] [place], [place] [address]   Phone: [phone]  Fax: [phone]        August 04, 2009 MRN: [id number]    CONNA TERADA [address] [place], [place]  [address]    Dear Ms. Kysar,  I am pleased to inform you that the biopsies taken during your recent endoscopic examination did not show any evidence of cancer upon pathologic examination.Biopsies show helicobacter infection.  Additional information/recommendations:  __No further action is needed at this time.  Please follow-up with      your primary care physician for your other healthcare needs.  _x_ Please call [phone] to schedule a return visit to review      your condition. We will call per new rx. to treat H.pylori....drp  __ __ You should have a repeat endoscopic examination for this problem              in _ months/years.   Please call us if you are having persistent problems or have questions about your condition that have not been fully answered at this time.  Sincerely,  Mardella Layman MD [facility]  This letter has been electronically signed by your physician.  Appended Document: Patient Notice-Endo Biopsy Results letter mailed.

## 2010-04-03 NOTE — Letter (Signed)
Summary: Appointment Reminder  [facility]  [address] [place], [place] [address]   Phone: [phone]  Fax: [phone]        November 15, 2009 MRN: [id number]    Kristine [name] [address] [place], [place]  [address]    Dear [name],   We have been unable to reach you by phone to schedule a follow up lab  appointment that was recommended for you by Dr. Jarold Motto. It is very   important that we reach you to schedule an appointment. We hope that you  allow [place] to participate in your health care needs. Please contact us at  [phone] at your earliest convenience to schedule your appointment.     Sincerely,    Harlow Mares CMA (AAMA)

## 2010-04-03 NOTE — Miscellaneous (Signed)
Summary: Orders Update Clotest  Clinical Lists Changes  Orders: Added new Test order of TLB-H Pylori Screen Gastric Biopsy (83013-CLOTEST) - Signed 

## 2010-04-03 NOTE — Letter (Signed)
Summary: Work [facility]  [address] [place], [place] [address]   Phone: [phone]  Fax: [phone]    Today's Date: Jul 25, 2009  Name of Patient: Kristine [name]  The above named patient had a medical visit today at:  1:30  pm.  Please take this into consideration when reviewing the time away from work/school.    Special Instructions:  Arly.Keller  ] None  [  ] To be off the remainder of today, returning to the normal work / school schedule tomorrow.  [  ] To be off until the next scheduled appointment on ______________________.  [  ] Other ________________________________________________________________ ________________________________________________________________________   Sincerely yours,   Ashok Cordia RN

## 2010-04-03 NOTE — Assessment & Plan Note (Signed)
Summary: COUGH/GREEN & BROWN PHLEGM   Vital Signs:  Patient profile:   45 year old female Weight:      163 pounds BMI:     28.98 Temp:     98.0 degrees F oral BP sitting:   152 / 92  (left arm) Cuff size:   regular  Vitals Entered By: Raechel Ache, RN (May 16, 2009 9:37 AM) CC: C/o sick since Sunday with fever 101.8, congestion, headache, aches, productive cough.   History of Present Illness: Here with her husband for 2 days of fevers to 102 degrees, HA, ST, chest congestion, and coughing up green sputum. She is nauseated but has not vomitted. Drnkning fluids and taking Tylenol.   Allergies: 1)  ! Codeine  Past History:  Past Medical History: Reviewed history from 07/27/2008 and no changes required. GERD Hyperlipidemia Hypertension endometriosis sees Dr. Kendra Ross for gyn exams mononucleosis 1-10  Review of Systems  The patient denies anorexia, weight loss, weight gain, vision loss, decreased hearing, hoarseness, chest pain, syncope, dyspnea on exertion, peripheral edema, hemoptysis, abdominal pain, melena, hematochezia, severe indigestion/heartburn, hematuria, incontinence, genital sores, muscle weakness, suspicious skin lesions, transient blindness, difficulty walking, depression, unusual weight change, abnormal bleeding, enlarged lymph nodes, angioedema, breast masses, and testicular masses.    Physical Exam  General:  alert but appears ill  Head:  Normocephalic and atraumatic without obvious abnormalities. No apparent alopecia or balding. Eyes:  No corneal or conjunctival inflammation noted. EOMI. Perrla. Funduscopic exam benign, without hemorrhages, exudates or papilledema. Vision grossly normal. Ears:  External ear exam shows no significant lesions or deformities.  Otoscopic examination reveals clear canals, tympanic membranes are intact bilaterally without bulging, retraction, inflammation or discharge. Hearing is grossly normal bilaterally. Nose:  External  nasal examination shows no deformity or inflammation. Nasal mucosa are pink and moist without lesions or exudates. Mouth:  Oral mucosa and oropharynx without lesions or exudates.  Teeth in good repair. Neck:  No deformities, masses, or tenderness noted. Lungs:  scattered rhonchi   Impression & Recommendations:  Problem # 1:  ACUTE BRONCHITIS (ICD-466.0)  Her updated medication list for this problem includes:    Augmentin 875-125 Mg Tabs (Amoxicillin-pot clavulanate) ..... Two times a day  Complete Medication List: 1)  Fluoxetine Hcl 20 Mg Caps (Fluoxetine hcl) .... Once daily 2)  Augmentin 875-125 Mg Tabs (Amoxicillin-pot clavulanate) .... Two times a day 3)  Fluconazole 150 Mg Tabs (Fluconazole) .... As needed  Patient Instructions: 1)  Please schedule a follow-up appointment as needed .  2)  Out of work today until 05-22-09. Prescriptions: FLUCONAZOLE 150 MG TABS (FLUCONAZOLE) as needed  #1 x 5   Entered and Authorized by:   Stephen A Fry MD   Signed by:   Stephen A Fry MD on 05/16/2009   Method used:   [facility] [id number]       [address]       [place], [place]  [address]       Ph: [phone]       Fax: [phone]   RxID:   [id number] AUGMENTIN 875-125 MG TABS (AMOXICILLIN-POT CLAVULANATE) two times a day  #20 x 0   Entered and Authorized by:   Nelwyn Salisbury MD   Signed by:   Nelwyn Salisbury MD on 05/16/2009   Method used:   [facility]  [facility]  [address] [id number]ail[facility]       [address]       [place], [place]  [address]       Ph: [phone]       Fax: [phone]   RxID:   [id number]

## 2010-04-03 NOTE — Progress Notes (Signed)
Summary: nexium approved by [employer] till 11-10-2008   Phone Note Other Incoming   Call placed by: [employer] Call placed to: Kristine [name]  Summary of Call: [employer] approved Nexium for one year till 11-10-2008 Initial call taken by: Roselle Locus,  November 11, 2007 9:05 AM  Follow-up for Phone Call        noted Follow-up by: Nelwyn Salisbury MD,  November 12, 2007 8:14 AM

## 2010-04-03 NOTE — Assessment & Plan Note (Signed)
Summary: EPIGASTRIC PAIN/RULE OUT GALLBLADDER DISEASE/YF   History of Present Illness Visit Type: Initial Consult Primary GI MD: [name] MD FACP FAGA Primary Provider: [name], MD Requesting Provider: [name], MD Chief Complaint: Upper abd pain with N/V/D around an hour after meals or sometimes during meals. Pt states the pain has getting worse every week.  History of Present Illness:   45 year old African American female referred by Dr. [name] for evaluation of years of recurrent epigastric abdominal pain worse over the last 3-4 weeks. This patient has chronic GERD with acid reflux and regurgitation and burning sternal chest pain alleviated by long-term Nexium use. In fact, she allegedly has been asymptomatic for the last several years all Nexium but discontinued this 2 months ago and has had recurrence of her epigastric discomfort. She has early satiety, nausea, regurgitation, frequent throat clearing, worsening pain with eating, but no true dysphagia or painful swallowing. She gives no history of specific hepatobiliary complaints or previous history of gallbladder or liver disease.  She denies abuse of NSAIDs but does use alcohol daily and smokes heavily. She's not had previous endoscopy or barium studies. She has chronic depression and is on Fluoxetine 20 mg a day and is using p.r.n. promethazine 25 mg tablets. She denies any known history of hepatitis or pancreatitis. She has regular bowel movements without melena or hematochezia. She's had a 20 pound weight loss over the last 2 years. Previous surgical procedures include hysterectomy and breast biopsy.   GI Review of Systems    Reports abdominal pain, acid reflux, belching, bloating, dysphagia with liquids, heartburn, nausea, vomiting, and  weight loss.     Location of  Abdominal pain: upper abdomen.    Denies chest pain, dysphagia with solids, loss of appetite, vomiting blood, and  weight gain.      Reports diarrhea.      Denies anal fissure, black tarry stools, change in bowel habit, constipation, diverticulosis, fecal incontinence, heme positive stool, hemorrhoids, irritable bowel syndrome, jaundice, light color stool, liver problems, rectal bleeding, and  rectal pain.    Current Medications (verified): 1)  Fluoxetine Hcl 20 Mg  Caps (Fluoxetine Hcl) .... Once Daily 2)  Nexium 40 Mg Cpdr (Esomeprazole Magnesium) .... Once Daily 3)  Promethazine Hcl 25 Mg Tabs (Promethazine Hcl) .Marland Kitchen.. 1 Q 4 Hours As Needed Nausea  Allergies (verified): 1)  ! Codeine  Past History:  Past medical, surgical, family and social histories (including risk factors) reviewed for relevance to current acute and chronic problems.  Past Medical History: Reviewed history from 07/14/2009 and no changes required. GERD Hyperlipidemia Hypertension endometriosis sees Dr. Waynard Reeds for gyn exams mononucleosis 1-10 alcohol abuse  Past Surgical History: Reviewed history from 07/15/2007 and no changes required. Hysterectomy 2005, cervix left intact Breast biopsy, benign Caesarean section 1992  Family History: Reviewed history from 07/15/2007 and no changes required. Family History of Arthritis Family History Breast cancer 1st degree relative <50 Family History Diabetes 1st degree relative Family History High cholesterol Family History Hypertension  Social History: Reviewed history from 07/15/2007 and no changes required. Married Current Smoker Alcohol use-yes Drug use-no Regular exercise-no Occupation: drives a school bus  Review of Systems       The patient complains of fatigue.  The patient denies allergy/sinus, anemia, anxiety-new, arthritis/joint pain, back pain, blood in urine, breast changes/lumps, change in vision, confusion, cough, coughing up blood, depression-new, fainting, fever, headaches-new, hearing problems, heart murmur, heart rhythm changes, itching, menstrual pain, muscle pains/cramps, night sweats,  nosebleeds, pregnancy symptoms, shortness of breath, skin rash, sleeping problems, sore throat, swelling of feet/legs, swollen lymph glands, thirst - excessive , urination - excessive , urination changes/pain, urine leakage, vision changes, and voice change.    Vital Signs:  Patient profile:   45 year old female Height:      63 inches Weight:      164 pounds BMI:     29.16 Pulse rate:   80 / minute BP sitting:   182 / 98  (left arm) Cuff size:   regular  Vitals Entered By: Christie Nottingham CMA Duncan Dull) (Jul 25, 2009 1:56 PM)  Physical Exam  General:  Well developed, well nourished, no acute distress.healthy appearing.   Head:  Normocephalic and atraumatic. Eyes:  PERRLA, no icterus.exam deferred to patient's ophthalmologist.   Neck:  Supple; no masses or thyromegaly. Lungs:  Clear throughout to auscultation. Heart:  Regular rate and rhythm; no murmurs, rubs,  or bruits. Abdomen:  Soft, nontender and nondistended. No masses, hepatosplenomegaly or hernias noted. Normal bowel sounds. Pulses:  Normal pulses noted. Extremities:  No clubbing, cyanosis, edema or deformities noted. Neurologic:  Alert and  oriented x4;  grossly normal neurologically. Cervical Nodes:  No significant cervical adenopathy. Psych:  Alert and cooperative. Normal mood and affect.anxious.     Impression & Recommendations:  Problem # 1:  ABDOMINAL PAIN, EPIGASTRIC (ICD-789.06) Assessment Improved Probable chronic H. pylori infection and relapsing peptic ulcer disease from cigarette, alcohol, and possibly NSAID abuse. She also has symptoms of chronic GERD responsive to PPI therapy. Other considerations would be ethanol induced chronic pancreatitis. Her symptoms seem inconsistent with cholelithiasis at this time. I have scheduled her for endoscopy, and increased her PPI therapy to b.i.d., prescribed Carafate 1 g after meals and at bedtime with frequent small feedings, we'll substitute tramadol 50 mg every 6-8 hours for  him said use, and check screening labs. She may eventually need upper abdominal ultrasound exam depending on our workup and her clinical course. Also ordered CDT level to assess alcohol intake. Orders: TLB-CBC Platelet - w/Differential (85025-CBCD) TLB-BMP (Basic Metabolic Panel-BMET) (80048-METABOL) TLB-Hepatic/Liver Function Pnl (80076-HEPATIC) TLB-TSH (Thyroid Stimulating Hormone) (84443-TSH) TLB-B12, Serum-Total ONLY (16109-U04) TLB-Ferritin (82728-FER) TLB-Folic Acid (Folate) (82746-FOL) TLB-IBC Pnl (Iron/FE;Transferrin) (83550-IBC) TLB-Amylase (82150-AMYL) TLB-Lipase (83690-LIPASE) T-CDT (carbohydrate deficient transferrin) (54098-11914)  Problem # 2:  NAUSEA AND VOMITING (ICD-787.01) Assessment: Improved Continue p.r.n. Phenergan. She did not want Zofran because of the expense. Orders: TLB-CBC Platelet - w/Differential (85025-CBCD) TLB-BMP (Basic Metabolic Panel-BMET) (80048-METABOL) TLB-Hepatic/Liver Function Pnl (80076-HEPATIC) TLB-TSH (Thyroid Stimulating Hormone) (84443-TSH) TLB-B12, Serum-Total ONLY (78295-A21) TLB-Ferritin (82728-FER) TLB-Folic Acid (Folate) (82746-FOL) TLB-IBC Pnl (Iron/FE;Transferrin) (83550-IBC) TLB-Amylase (82150-AMYL) TLB-Lipase (83690-LIPASE) T-CDT (carbohydrate deficient transferrin) (30865-78469)  Problem # 3:  DEPRESSION (ICD-311) Assessment: Improved Continue fluoxetine 20 mg a day as per Dr. Clent Ridges.  Problem # 4:  CIGARETTE SMOKER (ICD-305.1) Assessment: Unchanged  Patient Instructions: 1)  Please go to the basement for lab work. 2)  You are scheduled for an upper endoscopy. 3)  Begin carafate before meals and at bedtime. 4)  Increase Nexium to two times a day. 5)  Begin tramaldol as needed for pain. 6)  GI Reflux brochure given.  7)  The medication list was reviewed and reconciled.  All changed / newly prescribed medications were explained.  A complete medication list was provided to the patient / caregiver. 8)  Copy sent to : Dr.  [name] 9)  Please continue current medications.  10)  Conscious Sedation brochure given.  11)  Upper Endoscopy  brochure given.  12)  Avoidance of alcohol and NSAIDs.  Appended Document: EPIGASTRIC PAIN/RULE OUT GALLBLADDER DISEASE/YF    Clinical Lists Changes  Medications: Added new medication of CARAFATE 1 GM/10ML  SUSP (SUCRALFATE) 1 gm before meals and at bedtime - Signed Added new medication of TRAMADOL HCL 50 MG TABS (TRAMADOL HCL) 1 by mouth q 6-8 hrs as needed - Signed Changed medication from NEXIUM 40 MG CPDR (ESOMEPRAZOLE MAGNESIUM) once daily to NEXIUM 40 MG CPDR (ESOMEPRAZOLE MAGNESIUM) two times a day Rx of CARAFATE 1 GM/10ML  SUSP (SUCRALFATE) 1 gm before meals and at bedtime;  #14 gm x 1;  Signed;  Entered by: Ashok Cordia RN;  Authorized by: Mardella Layman MD [facility];  Method used: [facility] [id number] [address], [place], [place], [place]  [address], Ph: [phone], Fax: [phone] Rx of TRAMADOL HCL 50 MG TABS (TRAMADOL HCL) 1 by mouth q 6-8 hrs as needed;  #45 x 2;  Signed;  Entered by: Ashok Cordia RN;  Authorized by: Mardella Layman MD [facility];  Method used: [facility] [id number]*, [address], [place], [place], [place]  [address], Ph: [phone], Fax: [phone] Orders: Added new Test order of EGD (EGD) - Signed    Prescriptions: TRAMADOL HCL 50 MG TABS (TRAMADOL HCL) 1 by mouth q 6-8 hrs as needed  #45 x 2   Entered by:   Ashok Cordia RN   Authorized by:   Mardella Layman MD [facility]   Signed by:   Ashok Cordia RN on 07/25/2009   Method used:   [facility]        [facility]  [address] [phone]* (retail)       [address]       [place], [place]  [address]       Ph: [phone]       Fax: [phone]   RxID:   [id number] CARAFATE 1 GM/10ML  SUSP (SUCRALFATE) 1 gm before meals and at bedtime  #14 gm x 1   Entered by:   Ashok Cordia RN   Authorized by:    Mardella Layman MD [facility]   Signed by:   Ashok Cordia RN on 07/25/2009   Method used:   [facility]        [facility]  [address] [phone]* (retail)       [address]       [place], [place]  [address]       Ph: [phone]       Fax: [phone]   RxID:   [id number]

## 2010-04-03 NOTE — Assessment & Plan Note (Signed)
Summary: back pain/jls   Vital Signs:  Patient Profile:   45 Years Old Female Height:     63 inches Weight:      181 pounds Temp:     98.0 degrees F oral BP sitting:   122 / 84  (left arm) Cuff size:   large  Vitals Entered By: Alfred Levins, CMA (December 23, 2007 10:00 AM)                 Chief Complaint:  Back Pain x23mth, fever, congestion, and h/a.  History of Present Illness: Here with 2 problems. First about 4 weeks ago she had the sudden onset of pain and stiffness in the lower back. No hx of back pain. No recent trauma. Using Tylenol and heat. Then 2 days ago developed fevers to 101 degrees, body aches, ST, and dry cough. No NVD.     Current Allergies (reviewed today): ! CODEINE  Past Medical History:    Reviewed history from 07/15/2007 and no changes required:       GERD       Hyperlipidemia       Hypertension       endometriosis       sees Dr. Waynard Reeds for gyn exams     Review of Systems  The patient denies anorexia, weight loss, weight gain, vision loss, decreased hearing, hoarseness, chest pain, syncope, dyspnea on exertion, peripheral edema, hemoptysis, abdominal pain, melena, hematochezia, severe indigestion/heartburn, hematuria, incontinence, genital sores, muscle weakness, suspicious skin lesions, transient blindness, difficulty walking, depression, unusual weight change, abnormal bleeding, enlarged lymph nodes, angioedema, breast masses, and testicular masses.     Physical Exam  General:     appears quite ill and in pain Head:     Normocephalic and atraumatic without obvious abnormalities. No apparent alopecia or balding. Eyes:     No corneal or conjunctival inflammation noted. EOMI. Perrla. Funduscopic exam benign, without hemorrhages, exudates or papilledema. Vision grossly normal. Ears:     External ear exam shows no significant lesions or deformities.  Otoscopic examination reveals clear canals, tympanic membranes are intact bilaterally  without bulging, retraction, inflammation or discharge. Hearing is grossly normal bilaterally. Nose:     External nasal examination shows no deformity or inflammation. Nasal mucosa are pink and moist without lesions or exudates. Mouth:     Oral mucosa and oropharynx without lesions or exudates.  Teeth in good repair. Neck:     No deformities, masses, or tenderness noted. Lungs:     scattered rhonchi, no rales Msk:     very tender in lower back, especially over both sacroiliac joints, limited ROM    Impression & Recommendations:  Problem # 1:  SACROILIITIS (ICD-720.2)  Problem # 2:  ACUTE BRONCHITIS (ICD-466.0)  Her updated medication list for this problem includes:    Zithromax Z-pak 250 Mg Tabs (Azithromycin) .Marland Kitchen... As directed   Complete Medication List: 1)  Estroven Maximum Strength Tabs (Nutritional supplements) .Marland Kitchen.. 1 by mouth once daily with food 2)  Lisinopril-hydrochlorothiazide 20-25 Mg Tabs (Lisinopril-hydrochlorothiazide) .... Take 1 tab by mouth daily 3)  Fluoxetine Hcl 20 Mg Caps (Fluoxetine hcl) .... Once daily 4)  Simvastatin 40 Mg Tabs (Simvastatin) .... Once daily 5)  Nexium 40 Mg Cpdr (Esomeprazole magnesium) .... Once daily 6)  Prednisone 10 Mg Tabs (Prednisone) .... As directed 7)  Zithromax Z-pak 250 Mg Tabs (Azithromycin) .... As directed  Other Orders: UA Dipstick w/o Micro (automated)  (81003)   Patient Instructions: 1)  Drink  fluids, Tylenol as needed . Zpack for bronchitis. This is secondary to influenza. Out of work until 12-28-07. Will use a prednisone taper to help with back pain.  2)  Please schedule a follow-up appointment as needed.   Prescriptions: ZITHROMAX Z-PAK 250 MG TABS (AZITHROMYCIN) as directed  #1 x 0   Entered and Authorized by:   Nelwyn Salisbury MD   Signed by:   Nelwyn Salisbury MD on 12/23/2007   Method used:   Print then Give to Patient   RxID:   [id number] PREDNISONE 10 MG TABS (PREDNISONE) as directed  #100 x 0    Entered and Authorized by:   Nelwyn Salisbury MD   Signed by:   Nelwyn Salisbury MD on 12/23/2007   Method used:   Print then Give to Patient   RxID:   [id number]  ] Laboratory Results   Urine Tests   Date/Time Reported: December 23, 2007 9:44 AM   Routine Urinalysis   Color: yellow Appearance: Clear Glucose: negative   (Normal Range: Negative) Bilirubin: negative   (Normal Range: Negative) Ketone: negative   (Normal Range: Negative) Spec. Gravity: 1.020   (Normal Range: 1.003-1.035) Blood: trace-intact   (Normal Range: Negative) pH: 7.0   (Normal Range: 5.0-8.0) Protein: negative   (Normal Range: Negative) Urobilinogen: 0.2   (Normal Range: 0-1) Nitrite: negative   (Normal Range: Negative) Leukocyte Esterace: trace   (Normal Range: Negative)    Comments: Wynona Canes, CMA  December 23, 2007 9:44 AM

## 2010-04-03 NOTE — Progress Notes (Signed)
Summary: PCn sick  Phone Note Call from Patient Call back at [facility] Phone [phone]   Summary of Call: Hard to swallow the big Pcn & it makes her so sick.  [phone]. Initial call taken by: Rudy Jew, RN,  May 19, 2009 9:34 AM  Follow-up for Phone Call        change to a Zpack. Please one in Follow-up by: Nelwyn Salisbury MD,  May 19, 2009 10:13 AM  Additional Follow-up for Phone Call Additional follow up Details #1::        Rx Called In Additional Follow-up by: Raechel Ache, RN,  May 19, 2009 10:19 AM

## 2010-04-03 NOTE — Assessment & Plan Note (Signed)
Summary: pt fainted 02-24-08 had ct scan was normal/pt will come in fa...   Vital Signs:  Patient Profile:   45 Years Old Female Height:     63 inches Weight:      178 pounds Temp:     98.3 degrees F oral BP sitting:   142 / 90  (left arm) Cuff size:   regular  Vitals Entered By: Alfred Levins, CMA (March 01, 2008 9:47 AM)                 Chief Complaint:  fainted on christmas eve.  History of Present Illness: On 02-25-08 she was at home sitting at her computer after having had a big meal and some beers. When she stood up she suddenly collapsed and fell, striking the back of her head on a TV. This was witnessed, and there was no loss of consciousness. No shaking or clenching. She immediately had soreness on the back of the head and vomited once. After that she seem ed fine other than having some mild HA's and some mild dizziness. No more nausea after that. No blurrred vision or neurologic deficits. She remembers having a lot of sinus congestion for the past week, but no ST or cough or fever. Went to Urgent Care on 02-28-08 and was sent from there to get a noncontrasted head CT (which was normal). No labs were drawn. Since then she continues to congested anfd slightly dizzy.     Current Allergies: ! CODEINE  Past Medical History:    Reviewed history from 07/15/2007 and no changes required:       GERD       Hyperlipidemia       Hypertension       endometriosis       sees Dr. Waynard Reeds for gyn exams  Past Surgical History:    Reviewed history from 07/15/2007 and no changes required:       Hysterectomy 2005, cervix left intact       Breast biopsy, benign       Caesarean section 1992   Family History:    Reviewed history from 07/15/2007 and no changes required:       Family History of Arthritis       Family History Breast cancer 1st degree relative <50       Family History Diabetes 1st degree relative       Family History High cholesterol       Family History  Hypertension    Review of Systems  The patient denies anorexia, fever, weight loss, weight gain, vision loss, decreased hearing, hoarseness, chest pain, syncope, dyspnea on exertion, peripheral edema, prolonged cough, hemoptysis, abdominal pain, melena, hematochezia, severe indigestion/heartburn, hematuria, incontinence, genital sores, muscle weakness, suspicious skin lesions, transient blindness, difficulty walking, depression, unusual weight change, abnormal bleeding, enlarged lymph nodes, angioedema, breast masses, and testicular masses.     Physical Exam  General:     Well-developed,well-nourished,in no acute distress; alert,appropriate and cooperative throughout examination Head:     Normocephalic and atraumatic without obvious abnormalities. No apparent alopecia or balding. Eyes:     No corneal or conjunctival inflammation noted. EOMI. Perrla. Funduscopic exam benign, without hemorrhages, exudates or papilledema. Vision grossly normal. Ears:     External ear exam shows no significant lesions or deformities.  Otoscopic examination reveals clear canals, tympanic membranes are intact bilaterally without bulging, retraction, inflammation or discharge. Hearing is grossly normal bilaterally. Nose:     External nasal examination shows no  deformity or inflammation. Nasal mucosa are pink and moist without lesions or exudates. Mouth:     Oral mucosa and oropharynx without lesions or exudates.  Teeth in good repair. Neck:     No deformities, masses, or tenderness noted. Lungs:     Normal respiratory effort, chest expands symmetrically. Lungs are clear to auscultation, no crackles or wheezes. Heart:     Normal rate and regular rhythm. S1 and S2 normal without gallop, murmur, click, rub or other extra sounds.    Impression & Recommendations:  Problem # 1:  SYNCOPE (ICD-780.2)  Orders: TLB-B12, Serum-Total ONLY (16109-U04)   Problem # 2:  FAMILY HISTORY DIABETES 1ST DEGREE RELATIVE  (ICD-V18.0)  Problem # 3:  HYPERTENSION (ICD-401.9)  Her updated medication list for this problem includes:    Lisinopril-hydrochlorothiazide 20-25 Mg Tabs (Lisinopril-hydrochlorothiazide) .Marland Kitchen... Take 1 tab by mouth daily  Orders: UA Dipstick w/o Micro (automated)  (81003) Venipuncture (54098) TLB-Lipid Panel (80061-LIPID) TLB-BMP (Basic Metabolic Panel-BMET) (80048-METABOL) TLB-CBC Platelet - w/Differential (85025-CBCD) TLB-Hepatic/Liver Function Pnl (80076-HEPATIC) TLB-TSH (Thyroid Stimulating Hormone) (84443-TSH)   Complete Medication List: 1)  Estroven Maximum Strength Tabs (Nutritional supplements) .Marland Kitchen.. 1 by mouth once daily with food 2)  Lisinopril-hydrochlorothiazide 20-25 Mg Tabs (Lisinopril-hydrochlorothiazide) .... Take 1 tab by mouth daily 3)  Fluoxetine Hcl 20 Mg Caps (Fluoxetine hcl) .... Once daily 4)  Simvastatin 40 Mg Tabs (Simvastatin) .... Once daily 5)  Nexium 40 Mg Cpdr (Esomeprazole magnesium) .... Once daily 6)  Promethazine Hcl 25 Mg Tabs (Promethazine hcl) .Marland Kitchen.. 1 every 4 hours as needed nausea 7)  Tramadol Hcl 50 Mg Tabs (Tramadol hcl) .Marland Kitchen.. 1 by mouth every 6 hours   Patient Instructions: 1)  This seems to be the result of a labyrinthine disturbance, possibly related to sinus congestion. She will start Mucinex two times a day , and we will get labs today. She does have a family hx of diabetes. Rest, drink fluids.    ]  Appended Document: pt fainted 02-24-08 had ct scan was normal/pt will come in fa...  Laboratory Results   Urine Tests    Routine Urinalysis   Color: yellow Appearance: Clear Glucose: negative   (Normal Range: Negative) Bilirubin: negative   (Normal Range: Negative) Ketone: negative   (Normal Range: Negative) Spec. Gravity: 1.010   (Normal Range: 1.003-1.035) Blood: negative   (Normal Range: Negative) pH: 6.0   (Normal Range: 5.0-8.0) Protein: negative   (Normal Range: Negative) Urobilinogen: 0.2   (Normal Range: 0-1) Nitrite:  negative   (Normal Range: Negative) Leukocyte Esterace: trace   (Normal Range: Negative)    Comments: Rita Ohara  March 01, 2008 10:37 AM

## 2010-04-03 NOTE — Progress Notes (Signed)
  Phone Note Call from Patient   Caller: Patient Call For: Nelwyn Salisbury MD Summary of Call: Pt reminding [place] to schedule her GI appt.  Order sent to [facility]. Initial call taken by: Lynann Beaver CMA,  Jul 24, 2009 12:35 PM

## 2010-04-03 NOTE — Letter (Signed)
Summary: patient history  patient history   Imported By: Kassie Mends 07/22/2007 09:10:02  _____________________________________________________________________  External Attachment:    Type:   Image     Comment:   patient history

## 2010-04-03 NOTE — Assessment & Plan Note (Signed)
Summary: cough and chest pain/dm/pt rsc/cjr   Vital Signs:  Patient profile:   45 year old female Weight:      168 pounds Temp:     98.9 degrees F oral BP sitting:   152 / 104  (left arm) Cuff size:   regular  Vitals Entered By: Alfred Levins, CMA (November 30, 2008 11:42 AM) CC: cough, lump in chest   History of Present Illness: Here for 4 days of fevers, aches, HA, ST, and coughing up green sputum. Today her chest has a burning pain in it. Using Tylenol and drinking fluids.   Allergies: 1)  ! Codeine  Past History:  Past Medical History: Reviewed history from 07/27/2008 and no changes required. GERD Hyperlipidemia Hypertension endometriosis sees Dr. Waynard Reeds for gyn exams mononucleosis 1-10  Review of Systems  The patient denies anorexia, weight loss, weight gain, vision loss, decreased hearing, hoarseness, chest pain, syncope, dyspnea on exertion, peripheral edema, hemoptysis, abdominal pain, melena, hematochezia, severe indigestion/heartburn, hematuria, incontinence, genital sores, muscle weakness, suspicious skin lesions, transient blindness, difficulty walking, depression, unusual weight change, abnormal bleeding, enlarged lymph nodes, angioedema, breast masses, and testicular masses.    Physical Exam  General:  alert but appears ill, hoarse Head:  Normocephalic and atraumatic without obvious abnormalities. No apparent alopecia or balding. Eyes:  No corneal or conjunctival inflammation noted. EOMI. Perrla. Funduscopic exam benign, without hemorrhages, exudates or papilledema. Vision grossly normal. Ears:  External ear exam shows no significant lesions or deformities.  Otoscopic examination reveals clear canals, tympanic membranes are intact bilaterally without bulging, retraction, inflammation or discharge. Hearing is grossly normal bilaterally. Nose:  External nasal examination shows no deformity or inflammation. Nasal mucosa are pink and moist without lesions or  exudates. Mouth:  Oral mucosa and oropharynx without lesions or exudates.  Teeth in good repair. Neck:  No deformities, masses, or tenderness noted. Lungs:  scattered rhonchi   Impression & Recommendations:  Problem # 1:  ACUTE BRONCHITIS (ICD-466.0)  Her updated medication list for this problem includes:    Zithromax Z-pak 250 Mg Tabs (Azithromycin) .Marland Kitchen... As directed  Complete Medication List: 1)  Estroven Maximum Strength Tabs (Nutritional supplements) .Marland Kitchen.. 1 by mouth once daily with food 2)  Fluoxetine Hcl 20 Mg Caps (Fluoxetine hcl) .... Once daily 3)  Zithromax Z-pak 250 Mg Tabs (Azithromycin) .... As directed  Patient Instructions: 1)  Add Delsym as needed . Out of work today until 12-05-08.  Prescriptions: ZITHROMAX Z-PAK 250 MG TABS (AZITHROMYCIN) as directed  #1 x 0   Entered and Authorized by:   Nelwyn Salisbury MD   Signed by:   Nelwyn Salisbury MD on 11/30/2008   Method used:   Electronically to        [facility]  [address] [phone]* (retail)       [address]       [place], [place]  [address]       Ph: [phone]       Fax: [phone]   RxID:   [id number]

## 2010-04-03 NOTE — Assessment & Plan Note (Signed)
Summary: nausea/vomiting/dm   Vital Signs:  Patient profile:   45 year old female Weight:      160 pounds O2 Sat:      97 % Temp:     98.2 degrees F oral Pulse rate:   81 / minute Resp:     16 per minute BP sitting:   160 / 102  Vitals Entered By: Lynann Beaver CMA (Jul 14, 2009 10:30 AM) CC: diarrhea, vomiting, epigastric pain x 3 dys Is Patient Diabetic? No Pain Assessment Patient in pain? yes        History of Present Illness: Here with her daughter for 3 days of burning epigastric pain, nausea and vomiting, and diarrhea. No fever. No blood has been seen in the vomitus or stool, no black stools. She has a long hx of GERD and had benn on Nexium until she stopped taking this last year. She has continued tpo have occasional heartburn since then. She admits to drinking 6-12 beers a day on average.   Current Medications (verified): 1)  Fluoxetine Hcl 20 Mg  Caps (Fluoxetine Hcl) .... Once Daily  Allergies (verified): 1)  ! Codeine  Past History:  Past Medical History: GERD Hyperlipidemia Hypertension endometriosis sees Dr. Waynard Reeds for gyn exams mononucleosis 1-10 alcohol abuse  Past Surgical History: Reviewed history from 07/15/2007 and no changes required. Hysterectomy 2005, cervix left intact Breast biopsy, benign Caesarean section 1992  Review of Systems  The patient denies anorexia, fever, weight loss, weight gain, vision loss, decreased hearing, hoarseness, chest pain, syncope, dyspnea on exertion, peripheral edema, prolonged cough, headaches, hemoptysis, melena, hematochezia, severe indigestion/heartburn, hematuria, incontinence, genital sores, muscle weakness, suspicious skin lesions, transient blindness, difficulty walking, depression, unusual weight change, abnormal bleeding, enlarged lymph nodes, angioedema, breast masses, and testicular masses.    Physical Exam  General:  in pain, alert Neck:  No deformities, masses, or tenderness noted. Lungs:   Normal respiratory effort, chest expands symmetrically. Lungs are clear to auscultation, no crackles or wheezes. Heart:  Normal rate and regular rhythm. S1 and S2 normal without gallop, murmur, click, rub or other extra sounds. Abdomen:  soft, normal bowel sounds, no distention, no masses, no guarding, no rigidity, no rebound tenderness, no abdominal hernia, no inguinal hernia, no hepatomegaly, and no splenomegaly.  Moderately tender in the epigastrium.    Impression & Recommendations:  Problem # 1:  ABDOMINAL PAIN, EPIGASTRIC (ICD-789.06)  Complete Medication List: 1)  Fluoxetine Hcl 20 Mg Caps (Fluoxetine hcl) .... Once daily 2)  Nexium 40 Mg Cpdr (Esomeprazole magnesium) .... Once daily 3)  Promethazine Hcl 25 Mg Tabs (Promethazine hcl) .Marland Kitchen.. 1 q 4 hours as needed nausea  Other Orders: Admin of Therapeutic Inj  intramuscular or subcutaneous (45409)  Patient Instructions: 1)  This is consistent with either gastritis or an ulcer. I stringly advised her to decrease or stop her alcohol use. She will require acid suppression long term. get back on Nexium daily. We will set up an [place] soon to look at her gallbladder. Refer her to GI because i think she should have upper endoscopy. Out of work from 07-11-09 through today. Prescriptions: PROMETHAZINE HCL 25 MG TABS (PROMETHAZINE HCL) 1 q 4 hours as needed nausea  #60 x 2   Entered and Authorized by:   Nelwyn Salisbury MD   Signed by:   Nelwyn Salisbury MD on 07/14/2009   Method used:   [facility]        [facility]  [address] [phone]* (  retail)       [address]       [place], [place]  [address]       Ph: [phone]       Fax: [phone]   RxID:   [id number] NEXIUM 40 MG CPDR (ESOMEPRAZOLE MAGNESIUM) once daily  #30 x 11   Entered and Authorized by:   Nelwyn Salisbury MD   Signed by:   Nelwyn Salisbury MD on 07/14/2009   Method used:   [facility]        [facility]  [address] [phone]* (retail)       [address]       [place], [place]  [address]       Ph: [phone]       Fax: [phone]   RxID:   [id number]    Medication Administration  Injection # 1:    Medication: Promethazine up to 50mg     Diagnosis: GASTROENTERITIS, ACUTE (ICD-558.9)    Route: IM    Site: RUOQ gluteus    Exp Date: 07/11/2010    Lot #: [id number]    Mfr: novaplus    Patient tolerated injection without complications    Given by: Lynann Beaver CMA (Jul 14, 2009 11:19 AM)  Orders Added: 1)  Est. Patient Level IV [53976] 2)  Admin of Therapeutic Inj  intramuscular or subcutaneous [73419]

## 2010-04-03 NOTE — Assessment & Plan Note (Signed)
Summary: Follow up 2 weeks/dfs   History of Present Illness Visit Type: Follow-up Visit Primary GI MD: [name] MD FACP FAGA Primary Provider: [name], MD Requesting Provider: [name], MD Chief Complaint: Kristine [name] has no abdominal pain History of Present Illness:   This patient underwent extensive evaluation for upper abdominal pain, and is currently asymptomatic after being treated for H. pylori gastritis. Workup otherwise was negative including abdominal ultrasound and endoscopy. There was some suspicion of possible ethanol induced resolving pancreatitis although this was not confirmed. In any case, she currently is asymptomatic and will be finishing her Prevpac in the next one to 2 days.   GI Review of Systems      Denies abdominal pain, acid reflux, belching, bloating, chest pain, dysphagia with liquids, dysphagia with solids, heartburn, loss of appetite, nausea, vomiting, vomiting blood, weight loss, and  weight gain.        Denies anal fissure, black tarry stools, change in bowel habit, constipation, diarrhea, diverticulosis, fecal incontinence, heme positive stool, hemorrhoids, irritable bowel syndrome, jaundice, light color stool, liver problems, rectal bleeding, and  rectal pain.    Current Medications (verified): 1)  Fluoxetine Hcl 20 Mg  Caps (Fluoxetine Hcl) .... Once Daily 2)  Nexium 40 Mg Cpdr (Esomeprazole Magnesium) .... Two Times A Day 3)  Promethazine Hcl 25 Mg Tabs (Promethazine Hcl) .Marland Kitchen.. 1 Q 4 Hours As Needed Nausea 4)  Carafate 1 Gm/55ml  Susp (Sucralfate) .Marland Kitchen.. 1 Gm Before Meals and At Bedtime 5)  Tramadol Hcl 50 Mg Tabs (Tramadol Hcl) .Marland Kitchen.. 1 By Mouth Q 6-8 Hrs As Needed 6)  Lisinopril-Hydrochlorothiazide 20-25 Mg Tabs (Lisinopril-Hydrochlorothiazide) .Marland Kitchen.. 1 Once Daily 7)  Prevpac   Misc (Amoxicill-Clarithro-Lansopraz) .... Take As Directed  Allergies (verified): 1)  ! Codeine  Past History:  Past medical, surgical, family and social histories  (including risk factors) reviewed for relevance to current acute and chronic problems.  Past Medical History: Reviewed history from 07/14/2009 and no changes required. GERD Hyperlipidemia Hypertension endometriosis sees Dr. Waynard Reeds for gyn exams mononucleosis 1-10 alcohol abuse  Past Surgical History: Reviewed history from 07/15/2007 and no changes required. Hysterectomy 2005, cervix left intact Breast biopsy, benign Caesarean section 1992  Family History: Reviewed history from 07/15/2007 and no changes required. Family History of Arthritis Family History Breast cancer 1st degree relative <50 Family History Diabetes 1st degree relative Family History High cholesterol Family History Hypertension  Social History: Reviewed history from 07/15/2007 and no changes required. Married Current Smoker Alcohol use-yes Drug use-no Regular exercise-no Occupation: drives a school bus  Review of Systems       The patient complains of night sweats and thirst - excessive.  The patient denies allergy/sinus, anemia, anxiety-new, arthritis/joint pain, back pain, blood in urine, breast changes/lumps, change in vision, confusion, cough, coughing up blood, depression-new, fainting, fatigue, fever, headaches-new, hearing problems, heart murmur, heart rhythm changes, itching, menstrual pain, muscle pains/cramps, nosebleeds, pregnancy symptoms, shortness of breath, skin rash, sleeping problems, sore throat, swelling of feet/legs, swollen lymph glands, thirst - excessive , urination - excessive , urination changes/pain, urine leakage, vision changes, and voice change.    Vital Signs:  Patient profile:   45 year old female Height:      63 inches Weight:      162.50 pounds BMI:     28.89 Pulse rate:   88 / minute Pulse rhythm:   regular BP sitting:   152 / 90  (right arm) Cuff size:   regular  Vitals Entered  By: June McMurray CMA Duncan Dull) (August 21, 2009 10:55 AM)  Physical Exam  General:   Well developed, well nourished, no acute distress.healthy appearing.   Head:  Normocephalic and atraumatic. Eyes:  PERRLA, no icterus.exam deferred to patient's ophthalmologist.   Psych:  Alert and cooperative. Normal mood and affect.   Impression & Recommendations:  Problem # 1:  ABDOMINAL PAIN, EPIGASTRIC (ICD-789.06) Assessment Improved I have advised her to complete her therapy and I think she can probably stop her Nexium without difficulty. If she does well, we will do stool antigen check for H. pylori in 3 months. She is to advance her diet as tolerated and to avoid ethanol as possible  Problem # 2:  NAUSEA AND VOMITING (ICD-787.01) Assessment: Improved  Problem # 3:  HYPERTENSION (ICD-401.9) Assessment: Improved blood pressure today is 152/90. She is to continue her antihypertensive medication as per primary care.  Patient Instructions: 1)  Please continue current medications.  2)  Please return in 3 months for a lab test. September. 3)  The medication list was reviewed and reconciled.  All changed / newly prescribed medications were explained.  A complete medication list was provided to the patient / caregiver. 4)  Helicobacter Pylori handout given.  5)  Copy sent to : Dr. [name].

## 2010-04-03 NOTE — Assessment & Plan Note (Signed)
Summary: to be est/mhf   Vital Signs:  Patient Profile:   45 Years Old Female Height:     63 inches Weight:      184 pounds Temp:     98.3 degrees F oral Pulse rate:   84 / minute Pulse rhythm:   regular BP sitting:   122 / 82  (left arm) Cuff size:   large  Vitals Entered By: Alfred Levins, CMA (Jul 15, 2007 9:13 AM)                 Chief Complaint:  establish.  History of Present Illness: 45 yr old female to establish with [place] after transferring from the office of Dr. Sharlot Gowda. She has a number of questions. First she needs her cholesterol checked, but infortunately she ate breakfast this am. She was put on Simvastatin about a year ago. In 5-08 her LDL was 194. She has made some dietary changes as well. Her BP has been stable. She began to have meopausal symptoms severall years ago with mood swings, hot flashes, and poor sleep. FSH and LH were found to be at normal levels so no prescribed estrogen was reccomended at that time. However she started on an OTC supplement which has greatly eased these sx. She was started on Fluoxetine about 5 yrs ago duroing a very stressful time in her life. Since then things have gotten much better, and she denies much trouble with stress. She does admit to some fatigue and lack of libido. She recently saw Dr. Waynard Reeds for a gyn exam, and she was told to see [place] about these issues.     Current Allergies (reviewed today): ! CODEINE  Past Medical History:    Reviewed history and no changes required:       GERD       Hyperlipidemia       Hypertension       endometriosis       sees Dr. Waynard Reeds for gyn exams  Past Surgical History:    Reviewed history and no changes required:       Hysterectomy 2005, cervix left intact       Breast biopsy, benign       Caesarean section 1992   Family History:    Reviewed history and no changes required:       Family History of Arthritis       Family History Breast cancer 1st degree relative <50    Family History Diabetes 1st degree relative       Family History High cholesterol       Family History Hypertension  Social History:    Reviewed history and no changes required:       Married       Current Smoker       Alcohol use-yes       Drug use-no       Regular exercise-no       Occupation: drives a school bus          Risk Factors:  Tobacco use:  current Drug use:  no Alcohol use:  yes Exercise:  no   Review of Systems      See HPI   Physical Exam  General:     Well-developed,well-nourished,in no acute distress; alert,appropriate and cooperative throughout examination Neck:     No deformities, masses, or tenderness noted. Lungs:     Normal respiratory effort, chest expands symmetrically. Lungs are clear to auscultation, no crackles or  wheezes. Heart:     Normal rate and regular rhythm. S1 and S2 normal without gallop, murmur, click, rub or other extra sounds. Psych:     Cognition and judgment appear intact. Alert and cooperative with normal attention span and concentration. No apparent delusions, illusions, hallucinations    Impression & Recommendations:  Problem # 1:  GERD (ICD-530.81)  The following medications were removed from the medication list:    Zantac 150 Mg Caps (Ranitidine hcl) .Marland Kitchen... 1 by mouth once daily  Her updated medication list for this problem includes:    Nexium 40 Mg Cpdr (Esomeprazole magnesium) ..... Once daily   Problem # 2:  HYPERLIPIDEMIA (ICD-272.4)  The following medications were removed from the medication list:    Simvastatin 20 Mg Tabs (Simvastatin) .Marland Kitchen... 1 by mouth at bedtime  Her updated medication list for this problem includes:    Simvastatin 40 Mg Tabs (Simvastatin) ..... Once daily   Problem # 3:  HYPERTENSION (ICD-401.9)  Her updated medication list for this problem includes:    Lisinopril-hydrochlorothiazide 20-25 Mg Tabs (Lisinopril-hydrochlorothiazide) .Marland Kitchen... Take 1 tab by mouth daily   Problem # 4:   DEPRESSION (ICD-311)  The following medications were removed from the medication list:    Fluoxetine Hcl 40 Mg Caps (Fluoxetine hcl) .Marland Kitchen... 1 tablet by mouth daily  Her updated medication list for this problem includes:    Fluoxetine Hcl 20 Mg Caps (Fluoxetine hcl) ..... Once daily   Problem # 5:  MENOPAUSAL SYNDROME (ICD-627.2)  Complete Medication List: 1)  Estroven Maximum Strength Tabs (Nutritional supplements) .Marland Kitchen.. 1 by mouth once daily with food 2)  Lisinopril-hydrochlorothiazide 20-25 Mg Tabs (Lisinopril-hydrochlorothiazide) .... Take 1 tab by mouth daily 3)  Fluoxetine Hcl 20 Mg Caps (Fluoxetine hcl) .... Once daily 4)  Simvastatin 40 Mg Tabs (Simvastatin) .... Once daily 5)  Nexium 40 Mg Cpdr (Esomeprazole magnesium) .... Once daily   Patient Instructions: 1)  I think some of her libido problems may be side effects of the Fluoxetine, so we will try to taper her off this ASAP. The estrogen supplement seems to be helping, so we'll continue this for now. HTN is well controlled. We will set her up for cpx and labs soon. We need to closely follow her lipids. Will start Nexium for her GERD.   Prescriptions: NEXIUM 40 MG  CPDR (ESOMEPRAZOLE MAGNESIUM) once daily  #30 x 11   Entered and Authorized by:   Nelwyn Salisbury MD   Signed by:   Nelwyn Salisbury MD on 07/15/2007   Method used:   Print then Give to Patient   RxID:   [id number] LISINOPRIL-HYDROCHLOROTHIAZIDE 20-25 MG  TABS (LISINOPRIL-HYDROCHLOROTHIAZIDE) Take 1 tab by mouth daily  #30 x 11   Entered and Authorized by:   Nelwyn Salisbury MD   Signed by:   Nelwyn Salisbury MD on 07/15/2007   Method used:   Print then Give to Patient   RxID:   [id number] SIMVASTATIN 40 MG  TABS (SIMVASTATIN) once daily  #30 x 11   Entered and Authorized by:   Nelwyn Salisbury MD   Signed by:   Nelwyn Salisbury MD on 07/15/2007   Method used:   Print then Give to Patient   RxID:   [id number] FLUOXETINE HCL 20 MG  CAPS (FLUOXETINE HCL) once  daily  #30 x 11   Entered and Authorized by:   Nelwyn Salisbury MD   Signed by:   Nelwyn Salisbury MD on 07/15/2007  Method used:   Print then Give to Patient   RxID:   [id number] ESTROVEN MAXIMUM STRENGTH   TABS (NUTRITIONAL SUPPLEMENTS) 1 by mouth once daily with food  #30 x 11   Entered and Authorized by:   Nelwyn Salisbury MD   Signed by:   Nelwyn Salisbury MD on 07/15/2007   Method used:   Print then Give to Patient   RxID:   [id number]  ]

## 2010-04-05 ENCOUNTER — Telehealth: Payer: Self-pay | Admitting: *Deleted

## 2010-04-05 NOTE — Telephone Encounter (Signed)
Spoke w/ pt note for 2-2-and 2-3 will return 2-6 for bronchitis  Pt will pick up tomorrow or spouse will

## 2010-04-05 NOTE — Telephone Encounter (Deleted)
Wants work note for today and tomorrow  Will return to work on Monday 04-09-2010 Note given will pick up tomorrow

## 2010-04-05 NOTE — Telephone Encounter (Signed)
Pt needs to know if Dr. Clent Ridges will write a note to cover her for work until Monday.  Haywood Lasso:  [phone]

## 2010-04-06 NOTE — Telephone Encounter (Signed)
agreed

## 2010-04-11 NOTE — Assessment & Plan Note (Signed)
Summary: bronchitis/dm   Vital Signs:  Patient profile:   45 year old female Temp:     98.7 degrees F BP sitting:   120 / 80  (left arm) Cuff size:   regular  Vitals Entered By: Pura Spice, RN (April 03, 2010 4:25 PM) CC: cough congestion  body aches x 3 days had caough x 1 month  wants rx for phenergan and rx for urinaray incont   History of Present Illness: Here for 3 days of body aches, low grade fevers, HA, chest congestion, and coughing up green sputum.  Also she took samples of Vesicare for urinary urgency and they helped a lot. She asks for a rx for this.   Allergies: 1)  ! Codeine  Past History:  Past Medical History: Reviewed history from 08/29/2009 and no changes required. GERD Hyperlipidemia Hypertension endometriosis sees Dr. Waynard Reeds for gyn exams mononucleosis 1-10 alcohol abuse pancreatitis May 2011 Depression  Review of Systems  The patient denies anorexia, weight loss, weight gain, vision loss, decreased hearing, hoarseness, chest pain, syncope, dyspnea on exertion, peripheral edema, hemoptysis, abdominal pain, melena, hematochezia, severe indigestion/heartburn, hematuria, incontinence, genital sores, muscle weakness, suspicious skin lesions, transient blindness, difficulty walking, depression, unusual weight change, abnormal bleeding, enlarged lymph nodes, angioedema, breast masses, and testicular masses.    Physical Exam  General:  appears ill, alert  Head:  Normocephalic and atraumatic without obvious abnormalities. No apparent alopecia or balding. Eyes:  No corneal or conjunctival inflammation noted. EOMI. Perrla. Funduscopic exam benign, without hemorrhages, exudates or papilledema. Vision grossly normal. Ears:  External ear exam shows no significant lesions or deformities.  Otoscopic examination reveals clear canals, tympanic membranes are intact bilaterally without bulging, retraction, inflammation or discharge. Hearing is grossly normal  bilaterally. Nose:  External nasal examination shows no deformity or inflammation. Nasal mucosa are pink and moist without lesions or exudates. Mouth:  Oral mucosa and oropharynx without lesions or exudates.  Teeth in good repair. Neck:  No deformities, masses, or tenderness noted. Lungs:  Normal respiratory effort, chest expands symmetrically. Lungs are clear to auscultation, no crackles or wheezes.   Impression & Recommendations:  Problem # 1:  ACUTE BRONCHITIS (ICD-466.0)  Her updated medication list for this problem includes:    Zithromax Z-pak 250 Mg Tabs (Azithromycin) .Marland Kitchen... As directed  Problem # 2:  OVERACTIVE BLADDER (ICD-596.51)  Complete Medication List: 1)  Promethazine Hcl 25 Mg Tabs (Promethazine hcl) .Marland Kitchen.. 1 q 4 hours as needed nausea 2)  Tramadol Hcl 50 Mg Tabs (Tramadol hcl) .Marland Kitchen.. 1 by mouth q 6-8 hrs as needed 3)  Fluoxetine Hcl 40 Mg Caps (Fluoxetine hcl) .... Once daily 4)  Amlodipine Besylate 10 Mg Tabs (Amlodipine besylate) .... One daily 5)  Methocarbamol 500 Mg Tabs (Methocarbamol) .... One every 6 hours as needed for low back pain 6)  Oxybutynin Chloride 5 Mg Tabs (Oxybutynin chloride) .... Two times a day 7)  Zithromax Z-pak 250 Mg Tabs (Azithromycin) .... As directed  Patient Instructions: 1)  Out of work yesterday through tomorrow.  2)  Please schedule a follow-up appointment as needed .  Prescriptions: ZITHROMAX Z-PAK 250 MG TABS (AZITHROMYCIN) as directed  #1 x 0   Entered and Authorized by:   Nelwyn Salisbury MD   Signed by:   Nelwyn Salisbury MD on 04/03/2010   Method used:   Electronically to        [facility] [phone] (retail)       [address]       [place], [place]  [address]       Ph: [phone]       Fax: [phone]   RxID:   [id number] AMLODIPINE BESYLATE 10 MG TABS (AMLODIPINE BESYLATE) one daily  #30 x 11   Entered and Authorized by:   Nelwyn Salisbury MD   Signed by:   Nelwyn Salisbury MD on 04/03/2010    Method used:   [facility]ronically to        [facility] [phone]* (retail)       [address]       [place], [place]  [address]       Ph: [phone]       Fax: [phone]   RxID:   [id number] OXYBUTYNIN CHLORIDE 5 MG TABS (OXYBUTYNIN CHLORIDE) two times a day  #60 x 11   Entered and Authorized by:   Nelwyn Salisbury MD   Signed by:   Nelwyn Salisbury MD on 04/03/2010   Method used:   [facility]ronically to        [phone][facility] (retail)       [address]       [place], [place]  [address]       Ph: [phone]       Fax: [phone]   RxID:   [id number]    Orders Added: 1)  Est. Patient Level IV [50539]

## 2010-08-21 ENCOUNTER — Ambulatory Visit (INDEPENDENT_AMBULATORY_CARE_PROVIDER_SITE_OTHER): Payer: BC Managed Care – PPO | Admitting: Family Medicine

## 2010-08-21 ENCOUNTER — Encounter: Payer: Self-pay | Admitting: Family Medicine

## 2010-08-21 VITALS — BP 132/84 | HR 82 | Ht 62.5 in | Wt 173.0 lb

## 2010-08-21 DIAGNOSIS — Z0000 Encounter for general adult medical examination without abnormal findings: Secondary | ICD-10-CM

## 2010-08-21 LAB — POCT URINALYSIS DIPSTICK
Blood, UA: NEGATIVE
Glucose, UA: NEGATIVE
Nitrite, UA: NEGATIVE
Spec Grav, UA: 1.025
Urobilinogen, UA: 1
pH, UA: 6.5

## 2010-08-21 MED ORDER — LISINOPRIL-HYDROCHLOROTHIAZIDE 20-25 MG PO TABS: 1.0000 | ORAL_TABLET | Freq: Every day | ORAL | Status: DC

## 2010-08-21 MED ORDER — FLUOXETINE HCL 40 MG PO CAPS: 40.0000 mg | ORAL_CAPSULE | Freq: Every day | ORAL | Status: DC

## 2010-08-21 MED ORDER — OXYBUTYNIN CHLORIDE 5 MG PO TABS: 5.0000 mg | ORAL_TABLET | Freq: Three times a day (TID) | ORAL | Status: DC

## 2010-08-21 NOTE — Progress Notes (Signed)
  Subjective:    Patient ID: Kristine [name], female    DOB: 24-Feb-1966, 45 y.o.   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 She feels fine and her BP is well controlled. She was having edema from the Amlodipine so she switched back to Lisinopril HCT. No complaints today.   Review of Systems  Constitutional: Negative.   HENT: Negative.   Eyes: Negative.   Respiratory: Negative.   Cardiovascular: Negative.   Gastrointestinal: Negative.   Genitourinary: Negative for dysuria, urgency, frequency, hematuria, flank pain, decreased urine volume, enuresis, difficulty urinating, pelvic pain and dyspareunia.  Musculoskeletal: Negative.   Skin: Negative.   Neurological: Negative.   Hematological: Negative.   Psychiatric/Behavioral: Negative.        Objective:   Physical Exam  Constitutional: She is oriented to person, place, and time. She appears well-developed and well-nourished. No distress.  HENT:  Head: Normocephalic and atraumatic.  Right Ear: External ear normal.  Left Ear: External ear normal.  Nose: Nose normal.  Mouth/Throat: Oropharynx is clear and moist. No oropharyngeal exudate.  Eyes: Conjunctivae and EOM are normal. Pupils are equal, round, and reactive to light. No scleral icterus.  Neck: Normal range of motion. Neck supple. No JVD present. No thyromegaly present.  Cardiovascular: Normal rate, regular rhythm, normal heart sounds and intact distal pulses.  Exam reveals no gallop and no friction rub.   No murmur heard. Pulmonary/Chest: Effort normal and breath sounds normal. No respiratory distress. She has no wheezes. She has no rales. She exhibits no tenderness.  Abdominal: Soft. Bowel sounds are normal. She exhibits no distension and no mass. There is no tenderness. There is no rebound and no guarding.  Musculoskeletal: Normal range of motion. She exhibits no edema and no tenderness.  Lymphadenopathy:    She has no cervical adenopathy.    Neurological: She is alert and oriented to person, place, and time. She has normal reflexes. No cranial nerve deficit. She exhibits normal muscle tone. Coordination normal.  Skin: Skin is warm and dry. No rash noted. No erythema.  Psychiatric: She has a normal mood and affect. Her behavior is normal. Judgment and thought content normal.          Assessment & Plan:  Well exam. She declined any lab work at this time. Passed for 2 years of driving.

## 2011-05-17 ENCOUNTER — Encounter: Payer: Self-pay | Admitting: Family Medicine

## 2011-05-17 ENCOUNTER — Ambulatory Visit (INDEPENDENT_AMBULATORY_CARE_PROVIDER_SITE_OTHER): Payer: 59 | Admitting: Family Medicine

## 2011-05-17 VITALS — BP 130/82 | HR 73 | Temp 98.2°F | Wt 163.0 lb

## 2011-05-17 DIAGNOSIS — J329 Chronic sinusitis, unspecified: Secondary | ICD-10-CM

## 2011-05-17 MED ORDER — AZITHROMYCIN 250 MG PO TABS: ORAL_TABLET | ORAL | Status: AC

## 2011-05-17 NOTE — Progress Notes (Signed)
  Subjective:    Patient ID: Kristine [name], female    DOB: 1965-08-21, 46 y.o.   MRN: [id number]  HPI Here for 5 days of sinus pressure, PND, ST, and a dry cough.    Review of Systems  Constitutional: Negative.   HENT: Positive for congestion, postnasal drip and sinus pressure.   Eyes: Negative.   Respiratory: Positive for cough.        Objective:   Physical Exam  Constitutional: She appears well-developed and well-nourished.  HENT:  Right Ear: External ear normal.  Left Ear: External ear normal.  Nose: Nose normal.  Mouth/Throat: Oropharynx is clear and moist. No oropharyngeal exudate.  Eyes: Conjunctivae are normal.  Pulmonary/Chest: Effort normal and breath sounds normal.  Lymphadenopathy:    She has no cervical adenopathy.          Assessment & Plan:  Add Mucinex

## 2011-05-30 ENCOUNTER — Telehealth: Payer: Self-pay | Admitting: Family Medicine

## 2011-05-30 MED ORDER — LISINOPRIL-HYDROCHLOROTHIAZIDE 20-25 MG PO TABS: 1.0000 | ORAL_TABLET | Freq: Every day | ORAL | Status: DC

## 2011-05-30 MED ORDER — OXYBUTYNIN CHLORIDE 5 MG PO TABS: 5.0000 mg | ORAL_TABLET | Freq: Three times a day (TID) | ORAL | Status: DC

## 2011-05-30 MED ORDER — FLUOXETINE HCL 40 MG PO CAPS: 40.0000 mg | ORAL_CAPSULE | Freq: Every day | ORAL | Status: DC

## 2011-05-30 NOTE — Telephone Encounter (Signed)
I sent 2 scripts e-scribe and I called in 1 script. Dry. Clent Ridges did approve the 90 day supply for Prozac.

## 2011-06-04 ENCOUNTER — Telehealth: Payer: Self-pay | Admitting: Family Medicine

## 2011-06-04 MED ORDER — FLUTICASONE PROPIONATE 50 MCG/ACT NA SUSP: 2.0000 | Freq: Every day | NASAL | Status: DC

## 2011-06-04 NOTE — Telephone Encounter (Signed)
Addended by: Aniceto Boss A on: 06/04/2011 01:12 PM   Modules accepted: Orders

## 2011-06-04 NOTE — Telephone Encounter (Signed)
Sinus congestion rx saline irrigations decongestants such and sudafed .  Take allergy meds such as  Zyrtec if an issue.  Can call in flonase nasal spray disp 1# if wishes 2 spray per day for congestion treatment.  Further ecx from Dr Clent Ridges  Antibiotic usually not that helpful for sinus congestion  Otherwise.

## 2011-06-04 NOTE — Telephone Encounter (Signed)
I spoke with pt and she is not feeling better. She is still coughing and having nasal drainage. She completed all of the medication ( Z-pack ) and thinks that she needs another round of antibiotic, no fever.

## 2011-06-04 NOTE — Telephone Encounter (Signed)
Patient called stating that she was in the office two weeks ago for an ear and sinus infection and it has not cleared. Patient stated she would like a z pac called in. I informed patient that the MD is out of the office and she refused an appt with another md as she states she was just seen and can not afford another appt. Please advise.

## 2011-06-04 NOTE — Telephone Encounter (Signed)
I spoke with pt and sent script e-scribe. 

## 2011-06-06 ENCOUNTER — Ambulatory Visit (INDEPENDENT_AMBULATORY_CARE_PROVIDER_SITE_OTHER): Payer: 59 | Admitting: Internal Medicine

## 2011-06-06 ENCOUNTER — Encounter: Payer: Self-pay | Admitting: Internal Medicine

## 2011-06-06 VITALS — BP 140/80 | HR 79 | Temp 98.4°F

## 2011-06-06 DIAGNOSIS — J309 Allergic rhinitis, unspecified: Secondary | ICD-10-CM | POA: Insufficient documentation

## 2011-06-06 DIAGNOSIS — F17200 Nicotine dependence, unspecified, uncomplicated: Secondary | ICD-10-CM

## 2011-06-06 DIAGNOSIS — J019 Acute sinusitis, unspecified: Secondary | ICD-10-CM

## 2011-06-06 DIAGNOSIS — R112 Nausea with vomiting, unspecified: Secondary | ICD-10-CM

## 2011-06-06 DIAGNOSIS — Z720 Tobacco use: Secondary | ICD-10-CM

## 2011-06-06 MED ORDER — PREDNISONE 20 MG PO TABS: ORAL_TABLET | ORAL | Status: AC

## 2011-06-06 MED ORDER — CEFUROXIME AXETIL 500 MG PO TABS: 500.0000 mg | ORAL_TABLET | Freq: Two times a day (BID) | ORAL | Status: AC

## 2011-06-06 MED ORDER — PROMETHAZINE HCL 25 MG PO TABS: 25.0000 mg | ORAL_TABLET | Freq: Four times a day (QID) | ORAL | Status: DC | PRN

## 2011-06-06 NOTE — Patient Instructions (Signed)
Take antibiotic for sinusitis  Can add antinausea medication if needed. If congestion very severe add the prednisone . Warm compresses to face and warm liquids may help  Stop tobacco for your health. Expect improvement in the next 3-4 days or sooner . Contact us  Otherwise .

## 2011-06-06 NOTE — Progress Notes (Signed)
  Subjective:    Patient ID: Kristine [name], female    DOB: 1965-10-06, 46 y.o.   MRN: [id number]  HPI  Patient comes in today as a walk-in patient ceased though note from yesterday. She was treated a few weeks ago with azithromycin for 5-7 days of an upper respiratory infection. She never got better. She currently is now having headache face pain more on the right radiating right ear pain and sore throat. No current fever but is very congested has a cough sometimes brown phlegm no blood. Tried Flonase that was called in yesterday and it made her throw up. Doesn't get many headaches but has had a past history of migraine.  Tobacco  Every day .   1/2 ppds.    Review of Systems No fever diarrhea unusual rashes vision changes. Drove today no syncope shortness of b may have a little bit or wheezing.  Past history family history social history reviewed in the electronic medical record.     Objective:   Physical Exam BP 140/80  Pulse 79  Temp 98.4 F (36.9 C)  SpO2 98% well-developed well-nourished moderately ill in no acute distress holding her face. She is coherent and oriented. Normocephalic eyes PERRLA no congestion ears patent canals normal TMs normal landmarks.   narrates +2 congested with mucoid discharge maxillary area tender right more than mild OP +2 red no lesions noted good airway  Neck supple very tender right a.c. nodes and some shoddy PC nodes on the right Chest clear to auscultation and percussion no acute changes good air movement. Cvascular S1-S2 no gallops or murmurs regular rhythm abdomen soft without organomegaly. Skin warm normal turgor no acute rashes     Assessment & P    Prolonged upper respiratory congestion and sinusitis status post  z pcah rx still suspect bacterial secondary infection possibly underlying migraines. Ear pain appears to be referred.  Nausea and vomiting is most likely not from the Flonase but from her infection or underlying  migraine. Tobacco   discuss cessation. History of underlying allergy spring

## 2011-07-04 IMAGING — US US ABDOMEN COMPLETE
1 series · 14 of 25 positions shown · non-contrast
Comparison: None.

CLINICAL DATA: Upper abdominal pain.

COMPLETE ABDOMINAL ULTRASOUND

[Series 1: us abdomen complete · 0.30mm/px · 14 of 73 slices shown]
[im 1/73]
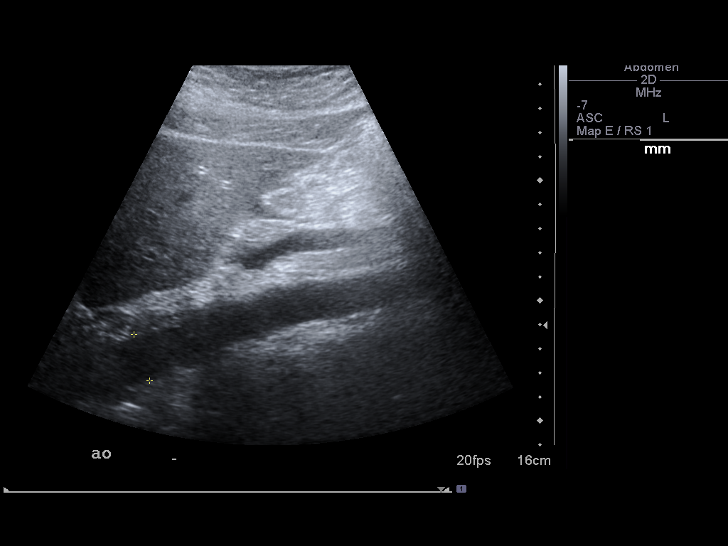
[im 7/73]
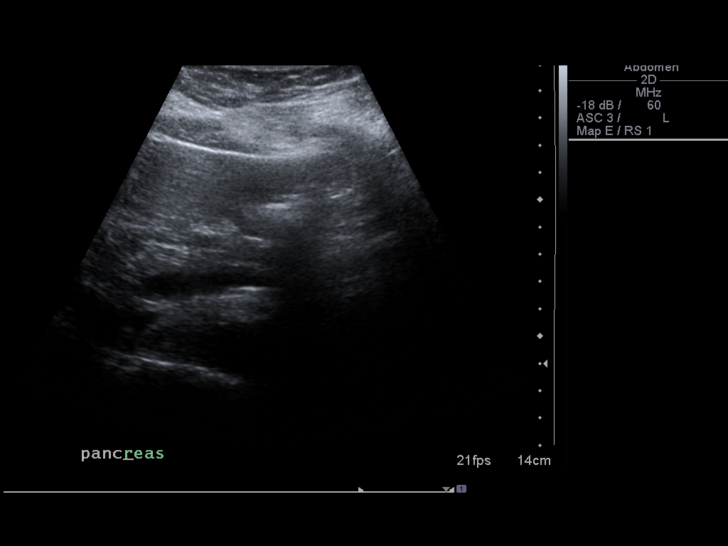
[im 13/73]
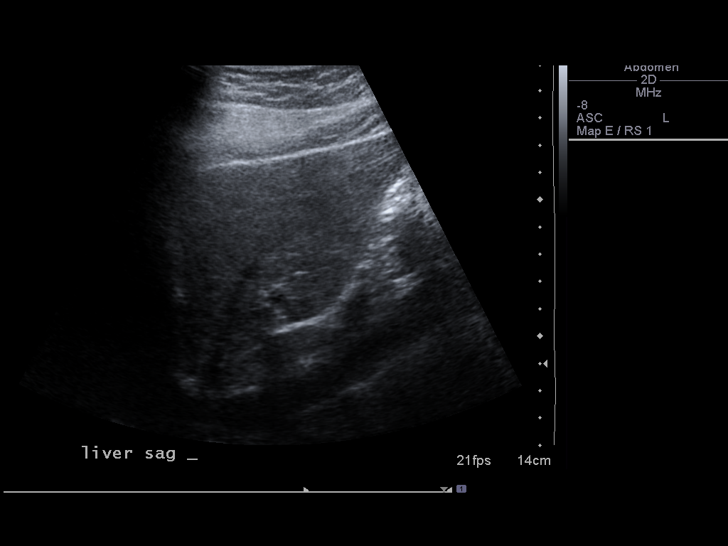
[im 19/73]
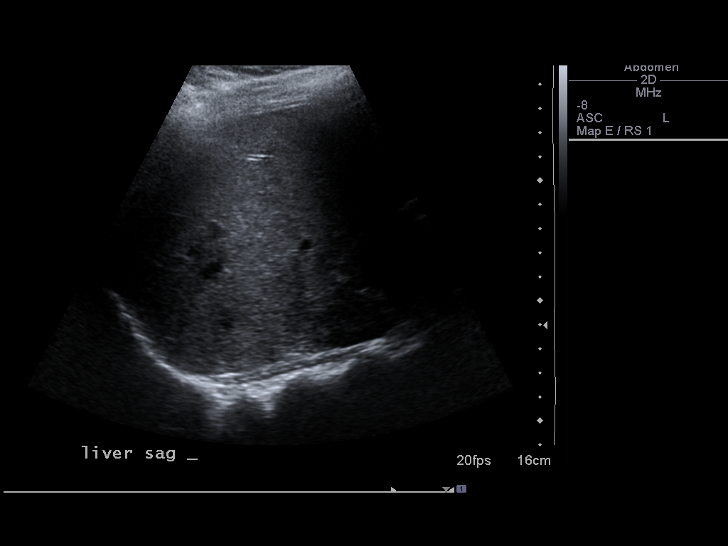
[im 25/73]
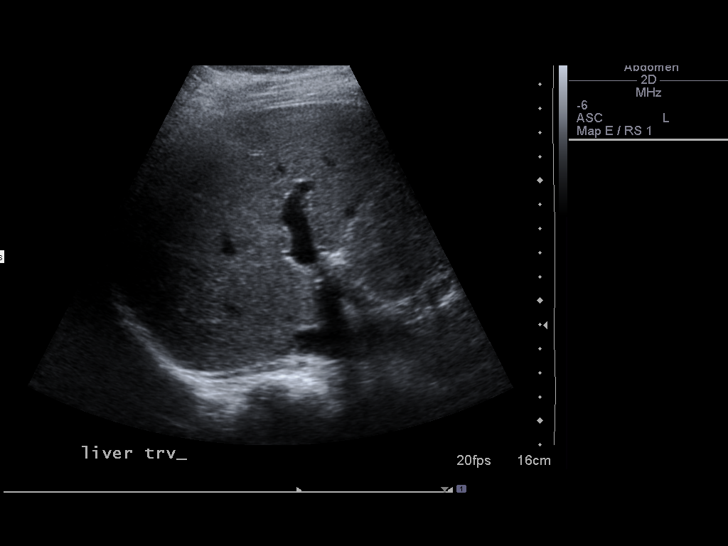
[im 28/73]
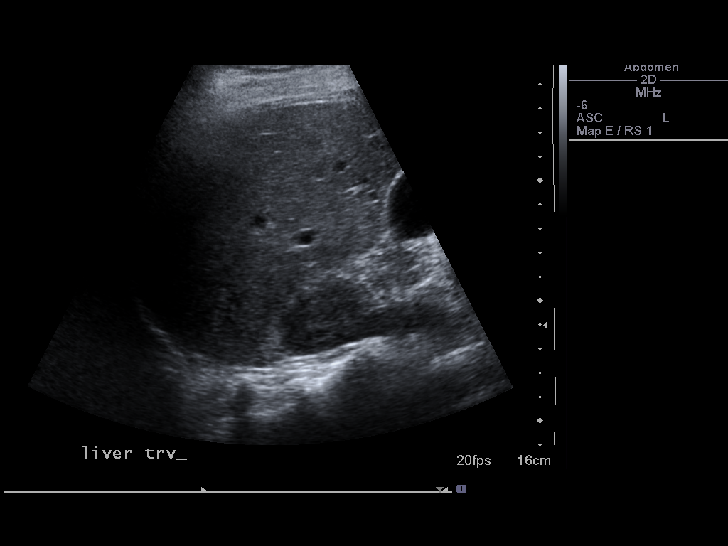
[im 34/73]
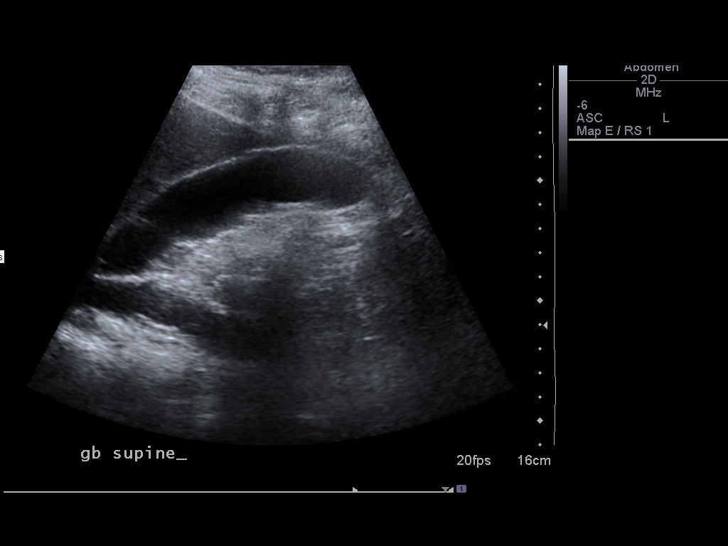
[im 40/73]
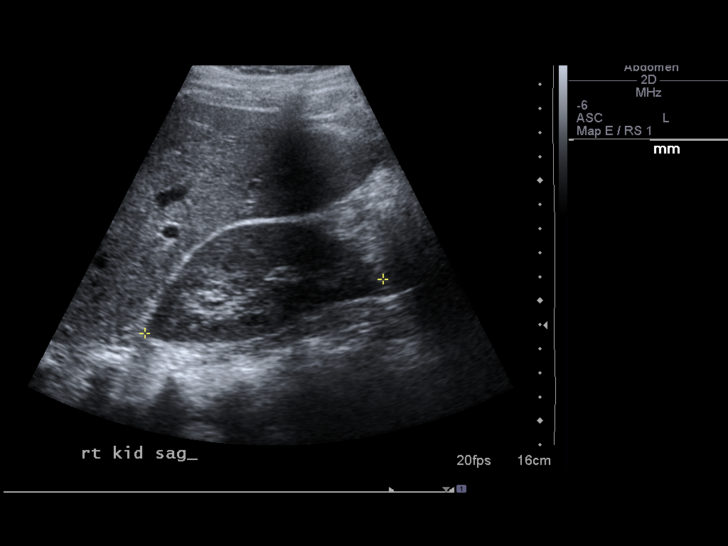
[im 46/73]
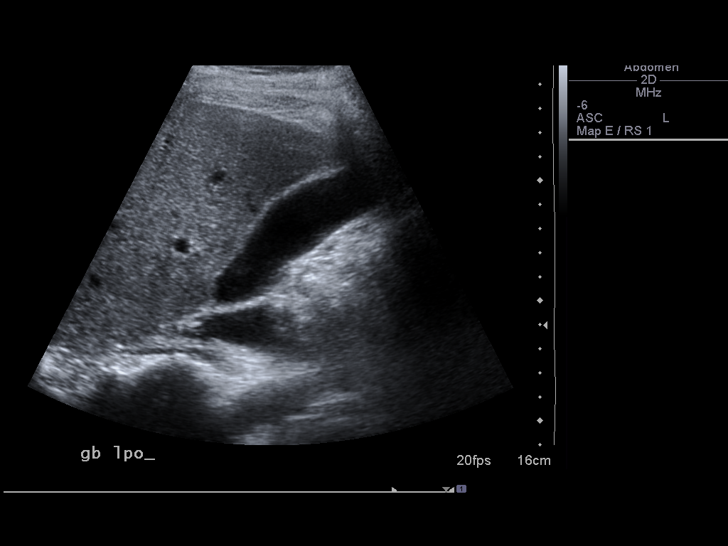
[im 49/73]
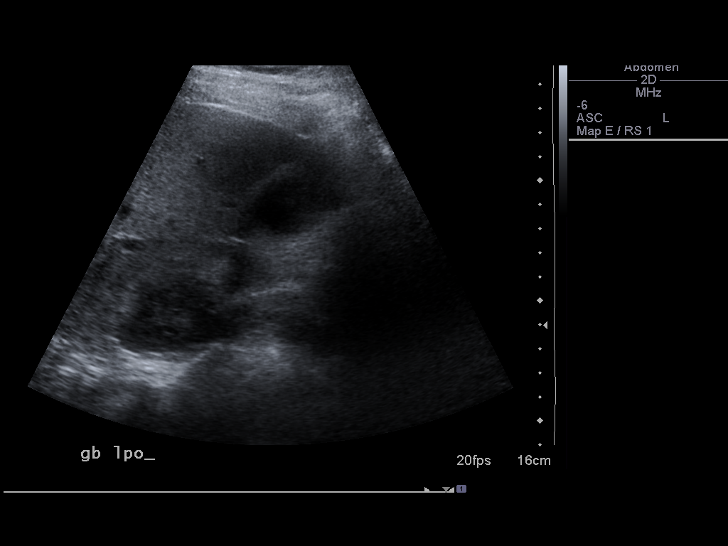
[im 55/73]
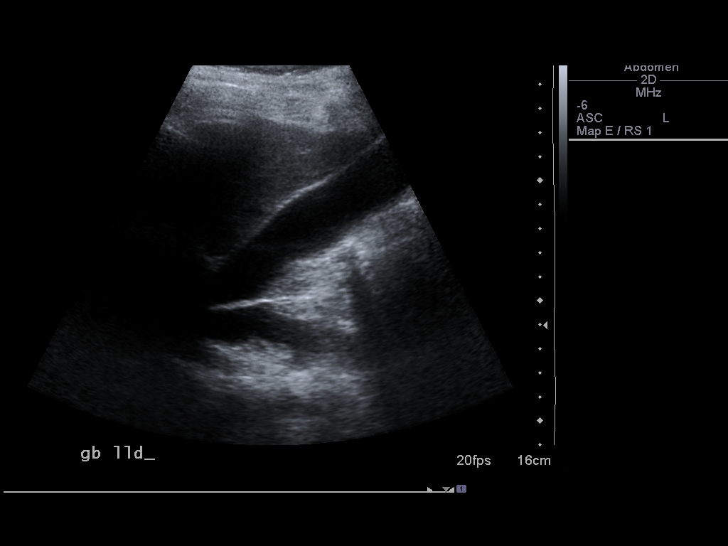
[im 61/73]
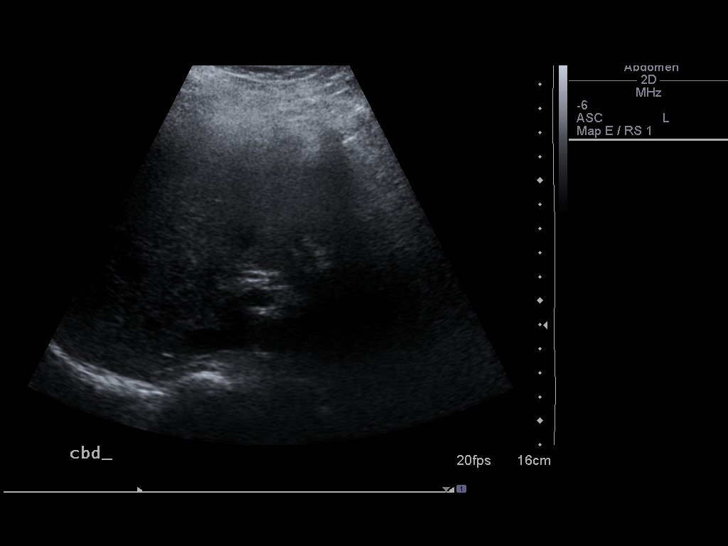
[im 67/73]
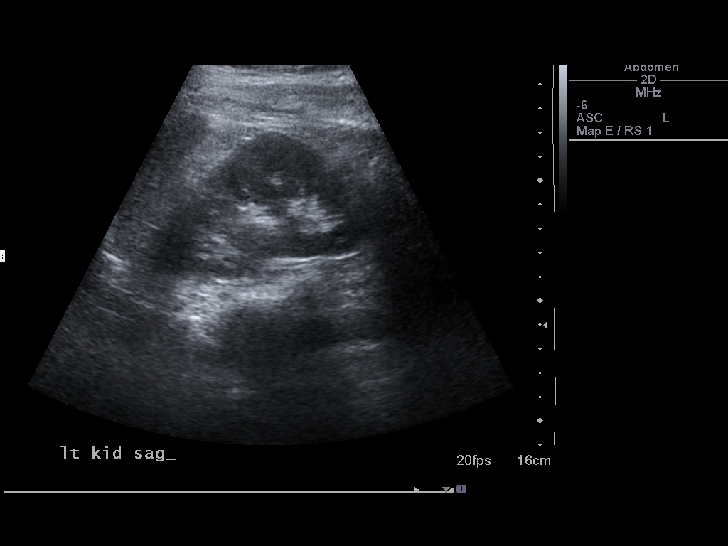
[im 73/73]
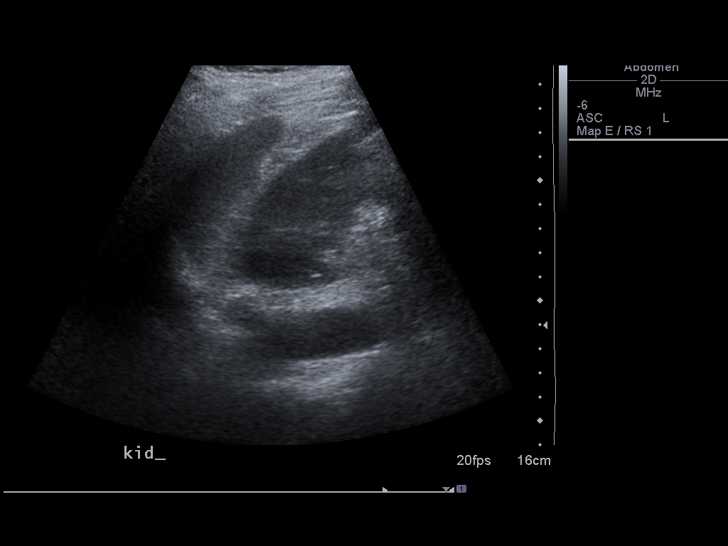

[14 of 25 positions shown; findings below may reference images not displayed]

FINDINGS: Gallbladder:  No gallstones, gallbladder wall thickening, or
pericholecystic fluid.

Common bile duct:  Measures 3 mm in diameter.

Liver:  No focal lesion identified.  Within normal limits in
parenchymal echogenicity.

IVC:  Appears normal.

Pancreas:  Suboptimal visualization of the pancreas due to adjacent
bowel gas.

Spleen:  Measures 4.6 cm in length.  No splenic lesion.

Right Kidney:  Measures 10.2 cm in length.  No renal mass or
hydronephrosis.

Left Kidney:  Measures 10.3 cm in length.  No renal mass or
hydronephrosis.

Abdominal aorta:  No aneurysm identified. Maximum diameter
abdominal aorta is 2.0 cm.
IMPRESSION: Negative abdominal ultrasound. Suboptimal visualization of the
pancreas.  This study of the pancreas is desired then consider CT
scan with IV contrast.

## 2011-07-11 ENCOUNTER — Telehealth: Payer: Self-pay | Admitting: Family Medicine

## 2011-07-11 MED ORDER — FLUCONAZOLE 150 MG PO TABS: 150.0000 mg | ORAL_TABLET | Freq: Once | ORAL | Status: DC

## 2011-07-11 NOTE — Telephone Encounter (Signed)
Pt was in on 06/06/11 and was given predniSONE (DELTASONE) 20 MG tablet. Pt is stating that this medication has caused her to have a yeast infection. Pt is requesting a rx for Diflucan. Please contact    [facility] [address]

## 2011-07-11 NOTE — Telephone Encounter (Signed)
Script sent e-scribe and left voice message for pt. 

## 2011-07-11 NOTE — Telephone Encounter (Signed)
Call in Diflucan 150 mg #1 with 11 rf  

## 2011-08-20 ENCOUNTER — Ambulatory Visit: Payer: 59 | Admitting: Family Medicine

## 2011-08-21 ENCOUNTER — Ambulatory Visit: Payer: 59 | Admitting: Family Medicine

## 2011-08-22 ENCOUNTER — Other Ambulatory Visit: Payer: Self-pay | Admitting: Family Medicine

## 2011-09-06 ENCOUNTER — Encounter: Payer: Self-pay | Admitting: Internal Medicine

## 2011-09-06 ENCOUNTER — Ambulatory Visit (INDEPENDENT_AMBULATORY_CARE_PROVIDER_SITE_OTHER): Payer: 59 | Admitting: Internal Medicine

## 2011-09-06 VITALS — BP 130/70 | Temp 102.6°F | Wt 160.0 lb

## 2011-09-06 DIAGNOSIS — R509 Fever, unspecified: Secondary | ICD-10-CM

## 2011-09-06 DIAGNOSIS — I10 Essential (primary) hypertension: Secondary | ICD-10-CM

## 2011-09-06 DIAGNOSIS — N390 Urinary tract infection, site not specified: Secondary | ICD-10-CM

## 2011-09-06 DIAGNOSIS — B9789 Other viral agents as the cause of diseases classified elsewhere: Secondary | ICD-10-CM

## 2011-09-06 LAB — POCT URINALYSIS DIPSTICK
Bilirubin, UA: NEGATIVE
Glucose, UA: NEGATIVE
Ketones, UA: NEGATIVE
Nitrite, UA: NEGATIVE
Spec Grav, UA: 1.01
Urobilinogen, UA: 0.2
pH, UA: 6

## 2011-09-06 MED ORDER — CIPROFLOXACIN HCL 500 MG PO TABS: 500.0000 mg | ORAL_TABLET | Freq: Two times a day (BID) | ORAL | Status: AC

## 2011-09-06 NOTE — Progress Notes (Signed)
Subjective:    Patient ID: Kristine [name], female    DOB: 11-13-1965, 46 y.o.   MRN: [id number]  HPI  46 year old patient who has a history of treated hypertension who presents with a 48-hour history of fever or chills and fatigue. She also complains of intermittent mild pins and needles sensation distal to the knees. No motor weakness. Denies any headache stiff neck skin rash tick exposure. Denies any cough abdominal pain change in bowel habits or dysuria. She does complain of frequent urination but has been forcing fluids. She also has a history of overactive bladder. Past Medical History  Diagnosis Date  . GERD (gastroesophageal reflux disease)   . Hyperlipidemia   . Hypertension   . Endometriosis   . Alcohol abuse   . Mononucleosis   . Pancreatitis   . Depression     History   Social History  . Marital Status: Married    Spouse Name: N/A    Number of Children: N/A  . Years of Education: N/A   Occupational History  . Not on file.   Social History Main Topics  . Smoking status: Current Everyday Smoker -- 0.5 packs/day    Types: Cigarettes  . Smokeless tobacco: Never Used  . Alcohol Use: 0.5 oz/week    1 drink(s) per week  . Drug Use: No  . Sexually Active: Not on file   Other Topics Concern  . Not on file   Social History Narrative  . No narrative on file    Past Surgical History  Procedure Date  . Abdominal hysterectomy 2005  . Breast biopsy     benign  . Cesarean section   . Esophagogastroduodenoscopy     Family History  Problem Relation Age of Onset  . Arthritis    . Breast cancer    . Diabetes    . Hyperlipidemia    . Hypertension      Allergies  Allergen Reactions  . Codeine     Current Outpatient Prescriptions on File Prior to Visit  Medication Sig Dispense Refill  . FLUoxetine (PROZAC) 40 MG capsule TAKE 1 TABLET BY MOUTH DAILY  90 capsule  0  . lisinopril-hydrochlorothiazide (PRINZIDE,ZESTORETIC) 20-25 MG per tablet TAKE 1 TABLET BY  MOUTH DAILY  90 tablet  0  . oxybutynin (DITROPAN) 5 MG tablet Take 1 tablet (5 mg total) by mouth 3 (three) times daily.  270 tablet  1  . fluticasone (FLONASE) 50 MCG/ACT nasal spray Place 2 sprays into the nose daily.  16 g  0  . promethazine (PHENERGAN) 25 MG tablet Take 1 tablet (25 mg total) by mouth every 6 (six) hours as needed for nausea.  15 tablet  0    BP 130/70  Temp 102.6 F (39.2 C) (Oral)  Wt 160 lb (72.576 kg)       Review of Systems  Constitutional: Positive for fever, chills, activity change, appetite change and fatigue.  HENT: Negative for hearing loss, congestion, sore throat, rhinorrhea, dental problem, sinus pressure and tinnitus.   Eyes: Negative for pain, discharge and visual disturbance.  Respiratory: Negative for cough and shortness of breath.   Cardiovascular: Negative for chest pain, palpitations and leg swelling.  Gastrointestinal: Negative for nausea, vomiting, abdominal pain, diarrhea, constipation, blood in stool and abdominal distention.  Genitourinary: Positive for frequency. Negative for dysuria, urgency, hematuria, flank pain, vaginal bleeding, vaginal discharge, difficulty urinating, vaginal pain and pelvic pain.  Musculoskeletal: Negative for joint swelling, arthralgias and gait problem.  Skin: Negative  for rash.  Neurological: Positive for numbness. Negative for dizziness, syncope, speech difficulty, weakness and headaches.  Hematological: Negative for adenopathy.  Psychiatric/Behavioral: Negative for behavioral problems, dysphoric mood and agitation. The patient is not nervous/anxious.        Objective:   Physical Exam  Constitutional: She is oriented to person, place, and time. She appears well-developed and well-nourished. No distress.       Appears unwell but in no acute distress alert and appropriate. Temperature 102.4   HENT:  Head: Normocephalic.  Right Ear: External ear normal.  Left Ear: External ear normal.  Mouth/Throat:  Oropharynx is clear and moist.  Eyes: Conjunctivae and EOM are normal. Pupils are equal, round, and reactive to light.  Neck: Normal range of motion. Neck supple. No thyromegaly present.  Cardiovascular: Normal rate, regular rhythm, normal heart sounds and intact distal pulses.        No tachycardia  Pulmonary/Chest: Effort normal and breath sounds normal.  Abdominal: Soft. Bowel sounds are normal. She exhibits no mass. There is no tenderness.  Musculoskeletal: Normal range of motion.  Lymphadenopathy:    She has no cervical adenopathy.  Neurological: She is alert and oriented to person, place, and time.       Reflexes intact No motor weakness No obvious sensory deficit  Skin: Skin is warm and dry. No rash noted.  Psychiatric: She has a normal mood and affect. Her behavior is normal.          Assessment & Plan:   Acute febrile illness.  We'll check a UA and a CBC. We'll treat symptomatically with, and anti-inflammatory medications Will report any clinical worsening  Urinalysis did reveal hematuria and pyuria. Will treat for suspected UTI with Cipro

## 2011-09-06 NOTE — Patient Instructions (Addendum)
Drink as much fluid as you  can tolerate over the next few days  Take [phone]  mg of Tylenol every 6 hours as needed for pain relief or fever.  Avoid taking more than 3000 mg in a 24-hour period (  This may cause liver damage).   Take Aleve 200 mg  Two  twice daily for pain or fever   Call or return to clinic prn if these symptoms worsen or fail to improve as anticipated.  Take your antibiotic as prescribed until ALL of it is gone, but stop if you develop a rash, swelling, or any side effects of the medication.  Contact our office as soon as possible if  there are side effects of the medication.

## 2011-09-09 ENCOUNTER — Telehealth: Payer: Self-pay | Admitting: Family Medicine

## 2011-09-09 NOTE — Telephone Encounter (Signed)
[facility] Triage Call Report Triage Record Num: [id number] Operator: Freddie Breech Patient Name: Kristine [name] Call Date & Time: 09/08/2011 1:12:23PM Patient Phone: [phone] PCP: Tera Mater. Clent Ridges Patient Gender: Female PCP Fax : [phone] Patient DOB: Apr 21, 1965 [facility] Name[facility] Reason for Call: Caller: Charise Carwin; PCP: Nelwyn Salisbury.; CB#: [phone]; Call regarding Medication Issue; Medication(s): Caller ? if pt can take Benadryl along with Advil and abx for a UTI. Advised yes may take per package directions. Protocol(s) Used: Medication Questions - Adult Recommended Outcome per Protocol: Provided Health Information Reason for Outcome: Caller has medication question(s) that was answered with available resources Care Advice: ~ 07/

## 2011-09-09 NOTE — Telephone Encounter (Signed)
[facility] Triage Call Report Triage Record Num: [id number] Operator: Revonda Humphrey Patient Name: Kristine [name] Call Date & Time: 09/07/2011 9:09:42AM Patient Phone: [phone] PCP: Tera Mater. Clent Ridges Patient Gender: Female PCP Fax : [phone] Patient DOB: 1965/05/25 Practice Name[facility] Reason for Call: Caller: Ashley/Other; PCP: Nelwyn Salisbury.; CB#: [phone]; Call regarding Allergic reaction; Started fevers to 102 on Wed 09/04/2011, started Tylenol Q4Hrs to keep temp 101-102. Seen in office yesterday am 7/5 and Dx UTI, started Cipro at 12 noon. Fever to 103 so changed to Motrin. Cipro 7 pm ate dinner, felt better, temp down 99-100. Took second dose Cipro, within 2-3 minutes started rash on bottom lip - felt like lip was burned and rash still present now. Has been taking Benadryl. Current temp 98.6 at 8:30 am today, feeling better. Worried about continuing Cipro. Spoke with Dr Tawanna Cooler - called Amox 500mg  [facility]ID [facility]10 [facility] [address] [phone]. To increase fluids especially water, be seen in office on Mon 7/8. Protocol(s) Used: Allergic Reaction, Severe Recommended Outcome per Protocol: Call Provider within 4 Hours Reason for Outcome: First occurrence of mild allergic reaction (rash; hives; itching; nasal congestion; watery, red eyes) that occurs within minutes to several hours after exposure to an allergen AND without any other signs or symptoms of anaphylaxis. Care Advice: ~ Apply cool compresses for 15 to 20 minutes 4 to 6 times a day to relieve discomfort. ~ Wear loose-fitting, non-restricting clothing. Cool/tepid showers or baths may help relieve itching. If cool water alone does not relieve itching, try adding 1/2 to 1 cup baking soda or colloidal oatmeal (Aveeno) to bath water. ~

## 2011-09-20 ENCOUNTER — Ambulatory Visit (INDEPENDENT_AMBULATORY_CARE_PROVIDER_SITE_OTHER): Payer: 59 | Admitting: Family Medicine

## 2011-09-20 ENCOUNTER — Encounter: Payer: Self-pay | Admitting: Family Medicine

## 2011-09-20 ENCOUNTER — Telehealth: Payer: Self-pay | Admitting: Family Medicine

## 2011-09-20 VITALS — BP 140/82 | HR 75 | Temp 98.7°F | Wt 158.0 lb

## 2011-09-20 DIAGNOSIS — N390 Urinary tract infection, site not specified: Secondary | ICD-10-CM

## 2011-09-20 LAB — POCT URINALYSIS DIPSTICK
Bilirubin, UA: NEGATIVE
Glucose, UA: NEGATIVE
Ketones, UA: NEGATIVE
Nitrite, UA: NEGATIVE
Protein, UA: NEGATIVE
Spec Grav, UA: 1.02
Urobilinogen, UA: 0.2
pH, UA: 7

## 2011-09-20 MED ORDER — HYDROCODONE-ACETAMINOPHEN 5-500 MG PO TABS: 1.0000 | ORAL_TABLET | Freq: Four times a day (QID) | ORAL | Status: AC | PRN

## 2011-09-20 MED ORDER — SULFAMETHOXAZOLE-TRIMETHOPRIM 800-160 MG PO TABS: 1.0000 | ORAL_TABLET | Freq: Two times a day (BID) | ORAL | Status: AC

## 2011-09-20 NOTE — Addendum Note (Signed)
Addended by: Aniceto Boss A on: 09/20/2011 03:20 PM   Modules accepted: Orders

## 2011-09-20 NOTE — Telephone Encounter (Signed)
Caller: Kristine [name]/Patient; PCP: Nelwyn Salisbury.; CB#: [phone];  Call regarding Urinary Pain/Bleeding;  Pt had UTI and anbx Amoxicillin taken.  She is sill having back pain. Per Epic chart pt has been on Cipro and Amoxicillin and continues to have SX.  Traiged UA SX Female and last voided at 1055. Disp = needs to be seen in 4hrs for UTI SX and back pain.   All emergent SX RO.  Home care and call back inst given.  Appt made with Clent Ridges at 1400, 09/20/11 for eval.  Pt lives in [place] and could not make an 1145 appt.

## 2011-09-20 NOTE — Progress Notes (Signed)
  Subjective:    Patient ID: Kristine [name], female    DOB: [birth year], 46 y.o.   MRN: [id number]  HPI Here for what she thinks is another bout of urine infection. She was seen on 09-06-11 for fever and a back ache. Her UA was positive, and she was started on Cipro. However after taking this for only 2 days she developed itching and hives all over, so she stopped. The hives resolved promptly. She felt back to normal for a week or so, but now the back ache is back and she has urgency to urinate. No fever.    Review of Systems  Constitutional: Negative.   Gastrointestinal: Negative.   Genitourinary: Positive for frequency. Negative for dysuria, hematuria, flank pain and difficulty urinating.  Musculoskeletal: Positive for back pain.       Objective:   Physical Exam  Constitutional: She appears well-developed and well-nourished.  Pulmonary/Chest: Effort normal and breath sounds normal.  Abdominal: Soft. Bowel sounds are normal. She exhibits no distension and no mass. There is no tenderness. There is no rebound and no guarding.          Assessment & Plan:  Partially treated UTI. Send the sample for a culture. Use Bactrim DS. Drink plenty of water

## 2011-09-23 LAB — URINE CULTURE: Colony Count: >=100000

## 2011-09-24 ENCOUNTER — Telehealth: Payer: Self-pay | Admitting: Family Medicine

## 2011-09-24 NOTE — Telephone Encounter (Signed)
Aspyn calling back after she missed RN call this am. Reports she was calling re recent labs. RN [employer] EPIC chart and provided the following info from MD,  "Her culture grew a common bacteria that should respond to the antibiotic she is taking."  Caller voices understanding and has no further questions.

## 2011-10-04 ENCOUNTER — Ambulatory Visit: Payer: 59 | Admitting: Family Medicine

## 2011-10-07 ENCOUNTER — Telehealth: Payer: Self-pay | Admitting: Family Medicine

## 2011-10-07 NOTE — Telephone Encounter (Signed)
Caller: [place]/Other; PCP: Nelwyn Salisbury.; CB#: [phone];  Call regarding Urine Symptoms; Finished Cipro for Urinary Tract Infection.  Out of town and missed f/u appointment. Daughter calling today and states patient's phone is out and she is still out of town but will be back in tonight and is still having yellow, cloudy urine.  Well hydrated with water,  no odor or pain.    Afebrile.  Per Urinary Symptoms - Female Protocol all emergent symptoms ruled out with exception of "Evaluated by provider and no improvement in symptoms after following treatment plan for the time specified by provider."  Appointment made for 10/08/11.  Care advice given.

## 2011-10-08 ENCOUNTER — Encounter: Payer: Self-pay | Admitting: Family Medicine

## 2011-10-08 ENCOUNTER — Ambulatory Visit (INDEPENDENT_AMBULATORY_CARE_PROVIDER_SITE_OTHER): Payer: 59 | Admitting: Family Medicine

## 2011-10-08 VITALS — BP 146/88 | HR 88 | Temp 98.5°F | Wt 159.0 lb

## 2011-10-08 DIAGNOSIS — N390 Urinary tract infection, site not specified: Secondary | ICD-10-CM

## 2011-10-08 LAB — POCT URINALYSIS DIPSTICK
Bilirubin, UA: NEGATIVE
Glucose, UA: NEGATIVE
Ketones, UA: NEGATIVE
Nitrite, UA: NEGATIVE
Protein, UA: NEGATIVE
Spec Grav, UA: 1.02
Urobilinogen, UA: 0.2
pH, UA: 7

## 2011-10-08 MED ORDER — NITROFURANTOIN MONOHYD MACRO 100 MG PO CAPS: 100.0000 mg | ORAL_CAPSULE | Freq: Two times a day (BID) | ORAL | Status: DC

## 2011-10-08 MED ORDER — LISINOPRIL 40 MG PO TABS: 40.0000 mg | ORAL_TABLET | Freq: Every day | ORAL | Status: DC

## 2011-10-08 NOTE — Progress Notes (Signed)
  Subjective:    Patient ID: Kristine [name], female    DOB: Mar 18, 1965, 46 y.o.   MRN: [id number]  HPI Here for continued UTI symptoms. She was seen here on 09-20-11 for a UTI that grew Enterococcus that was sensitive to every antibiotic on the panel. She was given Bactrim DS but this was not tested on the sensitivity panel for some reason. She felt better for while but now she has some urgency and burning again. No nausea or fever.   Review of Systems  Constitutional: Negative.   Genitourinary: Positive for dysuria, urgency and frequency. Negative for hematuria, flank pain and pelvic pain.       Objective:   Physical Exam  Constitutional: She appears well-developed and well-nourished.  Abdominal: Soft. Bowel sounds are normal. She exhibits no distension and no mass. There is no tenderness. There is no rebound and no guarding.          Assessment & Plan:  Partially treated UTI. Given Macrobid for 10 days (which was included in the sensitivity panel).

## 2011-10-28 ENCOUNTER — Other Ambulatory Visit: Payer: Self-pay | Admitting: Family Medicine

## 2011-11-27 ENCOUNTER — Telehealth: Payer: Self-pay | Admitting: Family Medicine

## 2011-11-27 DIAGNOSIS — N390 Urinary tract infection, site not specified: Secondary | ICD-10-CM

## 2011-11-27 MED ORDER — NITROFURANTOIN MONOHYD MACRO 100 MG PO CAPS: 100.0000 mg | ORAL_CAPSULE | Freq: Two times a day (BID) | ORAL | Status: DC

## 2011-11-27 NOTE — Telephone Encounter (Signed)
Have her come by the lab this afternoon to give [place] a urine sample. Do a UA and urine culture on the sample. In the meantime call in Macrobid 100 mg bid for 10 days.

## 2011-11-27 NOTE — Telephone Encounter (Signed)
I spoke with pt, sent script e-scribe and put lab orders in computer. Can you put pt on lab schedule for in the morning- she is going to walk in?

## 2011-11-27 NOTE — Telephone Encounter (Signed)
Caller: [name]/Patient; Patient Name: Kristine [name]; PCP: [name] [facility]); Best Callback Phone Number: [phone]; Reason for call: Urinary Pain/Bleeding.  Reports blood in urine with dysuria and back pain.  Onset: 11/26/11.  Afebrile. Hysterectomy.  Been treated 3X for UTI since 09/05/11.  Advised to see MD within 4 hours for  urinary tract symptoms and low back pain per Bloody Urine guideline. Is 45 minutes from office.  No appointments remain for 11/27/11 with Dr Clent Ridges; information sent to [facility] for possible work in apointment.  Please call back.

## 2011-11-28 ENCOUNTER — Telehealth: Payer: Self-pay | Admitting: Family Medicine

## 2011-11-28 ENCOUNTER — Other Ambulatory Visit: Payer: 59

## 2011-11-28 NOTE — Telephone Encounter (Signed)
Called and spoke with pt and pt states she cannot make it in physically tomorrow. Pt states she will have her husband bring the urine specimen in.  Advised pt to tell husband to keep specimen cold until it gets to the office.

## 2011-11-28 NOTE — Telephone Encounter (Signed)
Caller: Kristine [name]/Patient; Phone: [phone]; Reason for Call: Patient request to speak with Dr.  Claris Che nurse, spoke with the nurse yesterday regarding UTI.  Wants to know if she can have her husband bring a urine sample to the office, states she leaves away.  Please call her back.  Thanks

## 2012-02-03 ENCOUNTER — Other Ambulatory Visit: Payer: Self-pay | Admitting: Family Medicine

## 2012-02-20 ENCOUNTER — Other Ambulatory Visit: Payer: Self-pay | Admitting: Family Medicine

## 2012-05-20 ENCOUNTER — Other Ambulatory Visit: Payer: Self-pay | Admitting: Family Medicine

## 2012-05-20 NOTE — Telephone Encounter (Signed)
Refill request for Oxybutynin 5 mg take 1 po tid ( [facility] )

## 2012-05-25 ENCOUNTER — Telehealth: Payer: Self-pay | Admitting: Family Medicine

## 2012-05-25 MED ORDER — OXYBUTYNIN CHLORIDE 5 MG PO TABS: 5.0000 mg | ORAL_TABLET | Freq: Three times a day (TID) | ORAL | Status: DC

## 2012-05-25 NOTE — Telephone Encounter (Signed)
Refill request for Oxybutynin 5 mg and a 90 day supply, which I did send e-scribe.

## 2012-08-04 ENCOUNTER — Ambulatory Visit: Payer: 59 | Admitting: Family Medicine

## 2012-08-23 ENCOUNTER — Other Ambulatory Visit: Payer: Self-pay | Admitting: Family Medicine

## 2012-08-25 ENCOUNTER — Ambulatory Visit (INDEPENDENT_AMBULATORY_CARE_PROVIDER_SITE_OTHER): Payer: 59 | Admitting: Family Medicine

## 2012-08-25 ENCOUNTER — Encounter: Payer: Self-pay | Admitting: Family Medicine

## 2012-08-25 VITALS — BP 168/98 | HR 75 | Temp 98.1°F | Wt 149.0 lb

## 2012-08-25 DIAGNOSIS — F411 Generalized anxiety disorder: Secondary | ICD-10-CM

## 2012-08-25 DIAGNOSIS — R8290 Unspecified abnormal findings in urine: Secondary | ICD-10-CM

## 2012-08-25 DIAGNOSIS — N390 Urinary tract infection, site not specified: Secondary | ICD-10-CM

## 2012-08-25 DIAGNOSIS — R82998 Other abnormal findings in urine: Secondary | ICD-10-CM

## 2012-08-25 LAB — POCT URINALYSIS DIPSTICK
Bilirubin, UA: NEGATIVE
Glucose, UA: NEGATIVE
Ketones, UA: NEGATIVE
Nitrite, UA: NEGATIVE
Spec Grav, UA: <=1.005
Urobilinogen, UA: 0.2
pH, UA: 8.5

## 2012-08-25 MED ORDER — FLUOXETINE HCL 40 MG PO CAPS: 80.0000 mg | ORAL_CAPSULE | Freq: Every day | ORAL | Status: DC

## 2012-08-25 MED ORDER — NITROFURANTOIN MONOHYD MACRO 100 MG PO CAPS: 100.0000 mg | ORAL_CAPSULE | Freq: Two times a day (BID) | ORAL | Status: DC

## 2012-08-25 NOTE — Progress Notes (Signed)
  Subjective:    Patient ID: Kristine [name], female    DOB: 1965/06/14, 47 y.o.   MRN: [id number]  HPI Here for several things. First her Prozac has not been working as well as it used to. She has felt more anxious lately and more irritable. Sleeping well. Also she has had 2 days of urinary pressure and burning.    Review of Systems  Constitutional: Negative.   Gastrointestinal: Negative.   Genitourinary: Positive for dysuria, urgency and frequency. Negative for hematuria and flank pain.  Psychiatric/Behavioral: Positive for agitation. Negative for hallucinations, behavioral problems, confusion, dysphoric mood and decreased concentration. The patient is nervous/anxious.        Objective:   Physical Exam  Constitutional: She appears well-developed and well-nourished.  Abdominal: Soft. Bowel sounds are normal. She exhibits no distension and no mass. There is no tenderness. There is no rebound and no guarding.  Psychiatric: She has a normal mood and affect. Her behavior is normal. Thought content normal.          Assessment & Plan:  Treat the UTI with Macrobid and culture the urine. Increase Prozac to 80 mg a day.

## 2012-08-27 ENCOUNTER — Other Ambulatory Visit: Payer: Self-pay | Admitting: Family Medicine

## 2012-08-28 LAB — URINE CULTURE: Colony Count: >=100000

## 2012-08-28 NOTE — Progress Notes (Signed)
Quick Note:  I left voice message with results. ______ 

## 2012-09-07 ENCOUNTER — Other Ambulatory Visit: Payer: Self-pay | Admitting: Family Medicine

## 2012-10-02 ENCOUNTER — Telehealth: Payer: Self-pay | Admitting: Family Medicine

## 2012-10-02 ENCOUNTER — Encounter (HOSPITAL_BASED_OUTPATIENT_CLINIC_OR_DEPARTMENT_OTHER): Payer: Self-pay | Admitting: *Deleted

## 2012-10-02 ENCOUNTER — Emergency Department (HOSPITAL_BASED_OUTPATIENT_CLINIC_OR_DEPARTMENT_OTHER)
Admission: EM | Admit: 2012-10-02 | Discharge: 2012-10-02 | Disposition: A | Discharge status: Home / Self Care | Payer: 59 | Attending: Emergency Medicine | Admitting: Emergency Medicine

## 2012-10-02 DIAGNOSIS — Z79899 Other long term (current) drug therapy: Secondary | ICD-10-CM | POA: Insufficient documentation

## 2012-10-02 DIAGNOSIS — F1010 Alcohol abuse, uncomplicated: Secondary | ICD-10-CM | POA: Insufficient documentation

## 2012-10-02 DIAGNOSIS — R109 Unspecified abdominal pain: Secondary | ICD-10-CM | POA: Insufficient documentation

## 2012-10-02 DIAGNOSIS — F329 Major depressive disorder, single episode, unspecified: Secondary | ICD-10-CM | POA: Insufficient documentation

## 2012-10-02 DIAGNOSIS — Z8742 Personal history of other diseases of the female genital tract: Secondary | ICD-10-CM | POA: Insufficient documentation

## 2012-10-02 DIAGNOSIS — Z862 Personal history of diseases of the blood and blood-forming organs and certain disorders involving the immune mechanism: Secondary | ICD-10-CM | POA: Insufficient documentation

## 2012-10-02 DIAGNOSIS — Z8719 Personal history of other diseases of the digestive system: Secondary | ICD-10-CM | POA: Insufficient documentation

## 2012-10-02 DIAGNOSIS — Z8639 Personal history of other endocrine, nutritional and metabolic disease: Secondary | ICD-10-CM | POA: Insufficient documentation

## 2012-10-02 DIAGNOSIS — F3289 Other specified depressive episodes: Secondary | ICD-10-CM | POA: Insufficient documentation

## 2012-10-02 DIAGNOSIS — I10 Essential (primary) hypertension: Secondary | ICD-10-CM | POA: Insufficient documentation

## 2012-10-02 DIAGNOSIS — Z8619 Personal history of other infectious and parasitic diseases: Secondary | ICD-10-CM | POA: Insufficient documentation

## 2012-10-02 DIAGNOSIS — F17200 Nicotine dependence, unspecified, uncomplicated: Secondary | ICD-10-CM | POA: Insufficient documentation

## 2012-10-02 DIAGNOSIS — N390 Urinary tract infection, site not specified: Secondary | ICD-10-CM

## 2012-10-02 LAB — URINALYSIS, ROUTINE W REFLEX MICROSCOPIC
Bilirubin Urine: NEGATIVE
Glucose, UA: NEGATIVE mg/dL
Ketones, ur: NEGATIVE mg/dL
Nitrite: POSITIVE — AB
Protein, ur: 30 mg/dL — AB
Specific Gravity, Urine: 1.022 (ref 1.005–1.030)
Urobilinogen, UA: 0.2 mg/dL (ref 0.0–1.0)
pH: 6 (ref 5.0–8.0)

## 2012-10-02 LAB — URINE MICROSCOPIC-ADD ON

## 2012-10-02 MED ORDER — HYDROCODONE-ACETAMINOPHEN 5-325 MG PO TABS
2.0000 | ORAL_TABLET | Freq: Once | ORAL | Status: AC
  Administered 2012-10-02: 2 via ORAL
  Filled 2012-10-02: qty 2

## 2012-10-02 MED ORDER — CEPHALEXIN 250 MG PO CAPS
500.0000 mg | ORAL_CAPSULE | Freq: Once | ORAL | Status: AC
  Administered 2012-10-02: 500 mg via ORAL
  Filled 2012-10-02: qty 2

## 2012-10-02 MED ORDER — HYDROCODONE-ACETAMINOPHEN 5-325 MG PO TABS: ORAL_TABLET | ORAL | Status: DC

## 2012-10-02 MED ORDER — CEPHALEXIN 500 MG PO CAPS: 500.0000 mg | ORAL_CAPSULE | Freq: Three times a day (TID) | ORAL | Status: DC

## 2012-10-02 NOTE — Discharge Instructions (Signed)
 Urinary Tract Infection Urinary tract infections (UTIs) can develop anywhere along your urinary tract. Your urinary tract is your body's drainage system for removing wastes and extra water. Your urinary tract includes two kidneys, two ureters, a bladder, and a urethra. Your kidneys are a pair of bean-shaped organs. Each kidney is about the size of your fist. They are located below your ribs, one on each side of your spine. CAUSES Infections are caused by microbes, which are microscopic organisms, including fungi, viruses, and bacteria. These organisms are so small that they can only be seen through a microscope. Bacteria are the microbes that most commonly cause UTIs. SYMPTOMS  Symptoms of UTIs may vary by age and gender of the patient and by the location of the infection. Symptoms in young women typically include a frequent and intense urge to urinate and a painful, burning feeling in the bladder or urethra during urination. Older women and men are more likely to be tired, shaky, and weak and have muscle aches and abdominal pain. A fever may mean the infection is in your kidneys. Other symptoms of a kidney infection include pain in your back or sides below the ribs, nausea, and vomiting. DIAGNOSIS To diagnose a UTI, your caregiver will ask you about your symptoms. Your caregiver also will ask to provide a urine sample. The urine sample will be tested for bacteria and white blood cells. White blood cells are made by your body to help fight infection. TREATMENT  Typically, UTIs can be treated with medication. Because most UTIs are caused by a bacterial infection, they usually can be treated with the use of antibiotics. The choice of antibiotic and length of treatment depend on your symptoms and the type of bacteria causing your infection. HOME CARE INSTRUCTIONS  If you were prescribed antibiotics, take them exactly as your caregiver instructs you. Finish the medication even if you feel better after you  have only taken some of the medication.  Drink enough water and fluids to keep your urine clear or pale yellow.  Avoid caffeine, tea, and carbonated beverages. They tend to irritate your bladder.  Empty your bladder often. Avoid holding urine for long periods of time.  Empty your bladder before and after sexual intercourse.  After a bowel movement, women should cleanse from front to back. Use each tissue only once. SEEK MEDICAL CARE IF:   You have back pain.  You develop a fever.  Your symptoms do not begin to resolve within 3 days. SEEK IMMEDIATE MEDICAL CARE IF:   You have severe back pain or lower abdominal pain.  You develop chills.  You have nausea or vomiting.  You have continued burning or discomfort with urination. MAKE SURE YOU:   Understand these instructions.  Will watch your condition.  Will get help right away if you are not doing well or get worse. Document Released: 11/28/2004 Document Revised: 08/20/2011 Document Reviewed: 03/29/2011 [facility] Patient Information 2014 [place], [place].   Narcotic and benzodiazepine use may cause drowsiness, slowed breathing or dependence.  Please use with caution and do not drive, operate machinery or watch young children alone while taking them.  Taking combinations of these medications or drinking alcohol will potentiate these effects.

## 2012-10-02 NOTE — Telephone Encounter (Signed)
Call back attempted at 1510 on 8-1, vmail left.

## 2012-10-02 NOTE — Telephone Encounter (Signed)
Pt called back she is having back pain, frequency and the urgency to use the bathroom. Pt could not get off work till after 5, encouraged patient to go to a urgent care or ED, and to follow up with Dr. Clent Ridges.

## 2012-10-02 NOTE — Telephone Encounter (Signed)
No answer left VM for back to return call

## 2012-10-02 NOTE — ED Provider Notes (Signed)
CSN: [id number]     Arrival date & time 10/02/12  2027 History     First MD Initiated Contact with Patient 10/02/12 2055     Chief Complaint  Patient presents with  . Dysuria   (Consider location/radiation/quality/duration/timing/severity/associated sxs/prior Treatment) Patient is a 47 y.o. female presenting with dysuria and flank pain.  Dysuria Pain quality:  Burning and shooting Pain severity:  Moderate Duration:  1 day Timing:  Constant Progression:  Worsening Chronicity:  New Recent urinary tract infections: no   Ineffective treatments:  None tried Urinary symptoms: frequent urination   Urinary symptoms: no foul-smelling urine   Associated symptoms: abdominal pain and flank pain   Associated symptoms: no fever, no nausea and no vomiting   Risk factors: recurrent urinary tract infections   Flank Pain This is a new problem. The current episode started yesterday. The problem occurs constantly. The problem has been gradually worsening. Associated symptoms include abdominal pain. Pertinent negatives include no chest pain and no shortness of breath. Nothing relieves the symptoms.    Past Medical History  Diagnosis Date  . GERD (gastroesophageal reflux disease)   . Hyperlipidemia   . Hypertension   . Endometriosis   . Alcohol abuse   . Mononucleosis   . Pancreatitis   . Depression    Past Surgical History  Procedure Laterality Date  . Abdominal hysterectomy  2005  . Breast biopsy      benign  . Cesarean section    . Esophagogastroduodenoscopy     Family History  Problem Relation Age of Onset  . Arthritis    . Breast cancer    . Diabetes    . Hyperlipidemia    . Hypertension     History  Substance Use Topics  . Smoking status: Current Every Day Smoker -- 0.50 packs/day    Types: Cigarettes  . Smokeless tobacco: Never Used  . Alcohol Use: 0.5 oz/week    1 drink(s) per week   OB History   Grav Para Term Preterm Abortions TAB SAB Ect Mult Living                  Review of Systems  Constitutional: Negative for fever.  Respiratory: Negative for shortness of breath.   Cardiovascular: Negative for chest pain.  Gastrointestinal: Positive for abdominal pain. Negative for nausea and vomiting.  Genitourinary: Positive for dysuria and flank pain.  All other systems reviewed and are negative.    Allergies  Ciprofloxacin and Codeine  Home Medications   Current Outpatient Rx  Name  Route  Sig  Dispense  Refill  . cephALEXin (KEFLEX) 500 MG capsule   Oral   Take 1 capsule (500 mg total) by mouth 3 (three) times daily.   30 capsule   0   . fluconazole (DIFLUCAN) 150 MG tablet      TAKE 1 TABLET BY MOUTH ONCE   1 tablet   5   . FLUoxetine (PROZAC) 40 MG capsule   Oral   Take 2 capsules (80 mg total) by mouth daily.   180 capsule   3   . HYDROcodone-acetaminophen (NORCO/VICODIN) 5-325 MG per tablet      1-2 tablets po q 6 hours prn moderate to severe pain   20 tablet   0   . lisinopril (PRINIVIL,ZESTRIL) 40 MG tablet   Oral   Take 1 tablet (40 mg total) by mouth daily.   30 tablet   11   . nitrofurantoin, macrocrystal-monohydrate, (MACROBID) 100 MG capsule  TAKE 1 CAPSULE (100 MG TOTAL) BY MOUTH 2 (TWO) TIMES DAILY.   20 capsule   0   . oxybutynin (DITROPAN) 5 MG tablet      TAKE 1 TABLET BY MOUTH 3 TIMES A DAY   270 tablet   0    BP 207/97  Pulse 75  Temp(Src) 98.8 F (37.1 C) (Oral)  Resp 20  Ht 5\' 3"  (1.6 m)  Wt 149 lb (67.586 kg)  BMI 26.4 kg/m2  SpO2 99% Physical Exam  Nursing note and vitals reviewed. Constitutional: She appears well-developed and well-nourished. No distress.  HENT:  Head: Normocephalic and atraumatic.  Eyes: EOM are normal.  Pulmonary/Chest: Effort normal. No respiratory distress.  Abdominal: Soft. Normal appearance and bowel sounds are normal. She exhibits no distension. There is tenderness in the suprapubic area. There is CVA tenderness. There is no rebound, no guarding, no  tenderness at McBurney's point and negative Murphy's sign.  Skin: Skin is warm. No rash noted. She is not diaphoretic.    ED Course   Procedures (including critical care time)  Labs Reviewed  URINALYSIS, ROUTINE W REFLEX MICROSCOPIC - Abnormal; Notable for the following:    APPearance CLOUDY (*)    Hgb urine dipstick MODERATE (*)    Protein, ur 30 (*)    Nitrite POSITIVE (*)    Leukocytes, UA LARGE (*)    All other components within normal limits  URINE MICROSCOPIC-ADD ON - Abnormal; Notable for the following:    Squamous Epithelial / LPF FEW (*)    Bacteria, UA MANY (*)    All other components within normal limits  URINE CULTURE   No results found. 1. Urinary tract infection    ra sat is 99% and I interpret to be normal MDM  Pt with h/o UTI, multiple symptoms highly suggestive of UTI, pretest probability high. UA shows TNTC WBC's, bacteria, + nitrite and LE.  Will treat with 10 days of keflex, culture sent.  Pt ahs antispoasmodics at home already.  Rx for pain given.  Pt can follow up with PCP next week, return if wose.    Gavin Pound. Oletta Lamas, MD 10/02/12 2134

## 2012-10-02 NOTE — ED Notes (Signed)
MD at bedside. 

## 2012-10-02 NOTE — ED Notes (Signed)
Dysuria and urinary frequency.

## 2012-10-04 LAB — URINE CULTURE: Colony Count: >=100000

## 2012-10-05 ENCOUNTER — Telehealth: Payer: Self-pay | Admitting: Family Medicine

## 2012-10-05 NOTE — Telephone Encounter (Signed)
Per Dr. Clent Ridges, okay to schedule for Tuesday 10/06/12.

## 2012-10-05 NOTE — Telephone Encounter (Signed)
PT husband called in regards to scheduling his wife a post ED visit. He states that he was given an RX of hydrocodone and she is not in as much pain, but her BP is still elevated at 150/100.  Should she be seen today? Please assist.

## 2012-10-06 ENCOUNTER — Encounter: Payer: Self-pay | Admitting: Family Medicine

## 2012-10-06 ENCOUNTER — Ambulatory Visit (INDEPENDENT_AMBULATORY_CARE_PROVIDER_SITE_OTHER): Payer: 59 | Admitting: Family Medicine

## 2012-10-06 VITALS — BP 162/88 | HR 82 | Temp 98.6°F | Wt 150.0 lb

## 2012-10-06 DIAGNOSIS — I10 Essential (primary) hypertension: Secondary | ICD-10-CM

## 2012-10-06 DIAGNOSIS — N390 Urinary tract infection, site not specified: Secondary | ICD-10-CM

## 2012-10-06 MED ORDER — AMLODIPINE-VALSARTAN-HCTZ 5-160-25 MG PO TABS: 1.0000 | ORAL_TABLET | Freq: Every day | ORAL | Status: DC

## 2012-10-06 MED ORDER — HYDROCODONE-ACETAMINOPHEN 5-325 MG PO TABS: ORAL_TABLET | ORAL | Status: DC

## 2012-10-06 NOTE — Progress Notes (Signed)
  Subjective:    Patient ID: Kristine [name], female    DOB: November 30, 1965, 47 y.o.   MRN: [id number]  HPI Here to follow up on HTN and a UTI. She had urinary burning and back pain, and she was given 10 days of Keflex. Her culture grew pan-sensitive E coli. She does feel better at this point. However her BP that day was 207/97. She has been taking Lisinopril for several years now. No SOB or chest pain or HA.    Review of Systems  Constitutional: Negative.   Respiratory: Negative.   Cardiovascular: Negative.   Genitourinary: Positive for dysuria, urgency and frequency.  Neurological: Negative.        Objective:   Physical Exam  Constitutional: She appears well-developed and well-nourished.  Neck: No thyromegaly present.  Cardiovascular: Normal rate, regular rhythm, normal heart sounds and intact distal pulses.   Pulmonary/Chest: Effort normal and breath sounds normal.  Abdominal: Soft. Bowel sounds are normal. She exhibits no distension and no mass. There is no tenderness. There is no rebound and no guarding.  Lymphadenopathy:    She has no cervical adenopathy.          Assessment & Plan:  She will finish out the Keflex for the UTI. Stop Lisinopril and switch to Amlodipine-Valsartan-HCT daily. Recheck in 3 weeks. Written out of work from 10-05-12 until 10-12-12.

## 2012-10-13 ENCOUNTER — Other Ambulatory Visit: Payer: Self-pay | Admitting: Family Medicine

## 2012-10-19 ENCOUNTER — Telehealth: Payer: Self-pay | Admitting: Family Medicine

## 2012-10-19 NOTE — Telephone Encounter (Signed)
Patient Information:  Caller Name: Kristine [name]  Phone: [phone]  Patient: Kristine [name], Kristine [name]  Gender: Female  DOB: 1965/04/07  Age: 47 Years  PCP: [name] [facility])  Pregnant: No  Office Follow Up:  Does the office need to follow up with this patient?: No  Instructions For The Office: N/A  RN Note:  Hysterectomy.  May try otc urinary analgesic. Declined appointment for 10/19/12 due to work. Agreed to be scheduled for 10/20/12.  Symptoms  Reason For Call & Symptoms: Ongoing sympotms of UTI with increased frequency, strong odor, mild dysuria.  Completed Keflex.  Reviewed Health History In EMR: Yes  Reviewed Medications In EMR: Yes  Reviewed Allergies In EMR: Yes  Reviewed Surgeries / Procedures: Yes  Date of Onset of Symptoms: 10/02/2012  Treatments Tried: Keflex  Treatments Tried Worked: No OB / GYN:  LMP: Unknown  Guideline(s) Used:  Urination Pain - Female  Disposition Per Guideline:   See Today in Office  Reason For Disposition Reached:   > 2 UTIs in last year  Advice Given:  Fluids:   Drink extra fluids. Drink 8-10 glasses of liquids a day (Reason: to produce a dilute, non-irritating urine).  Call Back If:  You become worse.  Patient Will Follow Care Advice:  YES  Appointment Scheduled:  10/20/2012 10:30:00 Appointment Scheduled Provider:  [name] [facility])

## 2012-10-20 ENCOUNTER — Ambulatory Visit (INDEPENDENT_AMBULATORY_CARE_PROVIDER_SITE_OTHER): Payer: 59 | Admitting: Family Medicine

## 2012-10-20 ENCOUNTER — Encounter: Payer: Self-pay | Admitting: Family Medicine

## 2012-10-20 VITALS — BP 120/80 | Temp 98.4°F | Wt 146.0 lb

## 2012-10-20 DIAGNOSIS — G5600 Carpal tunnel syndrome, unspecified upper limb: Secondary | ICD-10-CM | POA: Insufficient documentation

## 2012-10-20 DIAGNOSIS — G5601 Carpal tunnel syndrome, right upper limb: Secondary | ICD-10-CM

## 2012-10-20 DIAGNOSIS — R300 Dysuria: Secondary | ICD-10-CM

## 2012-10-20 DIAGNOSIS — N390 Urinary tract infection, site not specified: Secondary | ICD-10-CM

## 2012-10-20 LAB — POCT URINALYSIS DIPSTICK
Bilirubin, UA: NEGATIVE
Blood, UA: NEGATIVE
Glucose, UA: NEGATIVE
Ketones, UA: NEGATIVE
Nitrite, UA: NEGATIVE
Protein, UA: NEGATIVE
Spec Grav, UA: 1.01
Urobilinogen, UA: 0.2
pH, UA: 6

## 2012-10-20 MED ORDER — CEPHALEXIN 500 MG PO CAPS: 500.0000 mg | ORAL_CAPSULE | Freq: Three times a day (TID) | ORAL | Status: DC

## 2012-10-20 NOTE — Progress Notes (Signed)
  Subjective:    Patient ID: Kristine [name], female    DOB: [birth year], 47 y.o.   MRN: [id number]  HPI Here for another UTI. We saw her a few weeks ago and she was given Keflex for urinary pressure and burning. These symptoms resolved for a time but now she has them again for the past 2 days.    Review of Systems  Constitutional: Negative.   Genitourinary: Positive for dysuria, urgency and frequency. Negative for flank pain and pelvic pain.       Objective:   Physical Exam  Constitutional: She appears well-developed and well-nourished.  Abdominal: Soft. Bowel sounds are normal. She exhibits no distension and no mass. There is no tenderness. There is no rebound and no guarding.          Assessment & Plan:  Get back on Keflex. Refer to Urology.

## 2012-10-27 ENCOUNTER — Ambulatory Visit: Payer: 59 | Admitting: Family Medicine

## 2012-10-27 ENCOUNTER — Encounter: Payer: Self-pay | Admitting: Family Medicine

## 2012-11-06 ENCOUNTER — Telehealth: Payer: Self-pay | Admitting: Family Medicine

## 2012-11-06 NOTE — Telephone Encounter (Signed)
error 

## 2012-11-17 ENCOUNTER — Other Ambulatory Visit: Payer: Self-pay | Admitting: Family Medicine

## 2012-11-17 NOTE — Telephone Encounter (Signed)
Can we refill this? 

## 2012-11-18 NOTE — Telephone Encounter (Signed)
Refill for one year 

## 2012-11-27 ENCOUNTER — Telehealth: Payer: Self-pay | Admitting: Family Medicine

## 2012-11-27 MED ORDER — AMLODIPINE-VALSARTAN-HCTZ 5-160-25 MG PO TABS: 1.0000 | ORAL_TABLET | Freq: Every day | ORAL | Status: DC

## 2012-11-27 NOTE — Telephone Encounter (Signed)
Refill request for Exforge HCT 5-160-25 mg and [facility], which I did send e-scribe.

## 2012-12-18 ENCOUNTER — Telehealth: Payer: Self-pay | Admitting: Family Medicine

## 2012-12-18 ENCOUNTER — Other Ambulatory Visit: Payer: Self-pay | Admitting: Family Medicine

## 2012-12-18 NOTE — Telephone Encounter (Signed)
Patient Information:  Caller Name: Alyvia  Phone: [phone]  Patient: Kristine [name], Kristine [name]  Gender: Female  DOB: [birth year]  Age: 47 Years  PCP: [name] [facility])  Pregnant: No  Office Follow Up:  Does the office need to follow up with this patient?: Yes  Instructions For The Office: Patient is requesting prescription.  RN Note:  Patient unable to come to office for appointment at this time due to being at work. She is requesting a medicine to be called in for her symptoms. She would like someone to call her back and leave a detailed message regarding her request. Please contact patient regarding request for medication.  Symptoms  Reason For Call & Symptoms: Reports left flank pain. Reports she also has urgency.  Reviewed Health History In EMR: Yes  Reviewed Medications In EMR: Yes  Reviewed Allergies In EMR: Yes  Reviewed Surgeries / Procedures: Yes  Date of Onset of Symptoms: 12/17/2012 OB / GYN:  LMP: Unknown  Guideline(s) Used:  Urination Pain - Female  Disposition Per Guideline:   Go to Office Now  Reason For Disposition Reached:   Side (flank) or lower back pain present  Advice Given:  N/A  Patient Will Follow Care Advice:  YES

## 2012-12-18 NOTE — Telephone Encounter (Signed)
I left voice message for pt, I would forward this note to Dr. Clent Ridges, he will be back in the office on 12/21/12. If pt's symptoms worsen or she can't wait, then she should try a Urgent Care.

## 2012-12-21 NOTE — Telephone Encounter (Signed)
I left voice message with below information. 

## 2012-12-21 NOTE — Telephone Encounter (Signed)
She needs to be seen for this

## 2012-12-22 ENCOUNTER — Telehealth: Payer: Self-pay | Admitting: Family Medicine

## 2012-12-22 NOTE — Telephone Encounter (Signed)
Pt saw urologist today and no longer needs abx. Pt urologist gave her rx

## 2013-01-12 ENCOUNTER — Other Ambulatory Visit: Payer: Self-pay | Admitting: Obstetrics and Gynecology

## 2013-01-12 DIAGNOSIS — N644 Mastodynia: Secondary | ICD-10-CM

## 2013-01-15 ENCOUNTER — Ambulatory Visit
Admission: RE | Admit: 2013-01-15 | Discharge: 2013-01-15 | Disposition: A | Discharge status: Home / Self Care | Payer: 59 | Source: Ambulatory Visit | Attending: Obstetrics and Gynecology | Admitting: Obstetrics and Gynecology

## 2013-01-15 DIAGNOSIS — N644 Mastodynia: Secondary | ICD-10-CM

## 2013-04-05 ENCOUNTER — Other Ambulatory Visit: Payer: Self-pay | Admitting: Family Medicine

## 2013-07-08 ENCOUNTER — Other Ambulatory Visit: Payer: Self-pay | Admitting: Family Medicine

## 2013-07-08 NOTE — Telephone Encounter (Signed)
I sent script e-scribe. 

## 2013-08-30 ENCOUNTER — Telehealth: Payer: Self-pay | Admitting: Family Medicine

## 2013-08-30 NOTE — Telephone Encounter (Signed)
Pt req rx FLUoxetine (PROZAC) 40 MG capsule, oxybutynin (DITROPAN) 5 MG tablet pt req increase 2 pills 3 times a day

## 2013-09-04 ENCOUNTER — Other Ambulatory Visit: Payer: Self-pay | Admitting: Family Medicine

## 2013-09-06 MED ORDER — FLUOXETINE HCL 40 MG PO CAPS: 80.0000 mg | ORAL_CAPSULE | Freq: Every day | ORAL | Status: DC

## 2013-09-06 MED ORDER — OXYBUTYNIN CHLORIDE 5 MG PO TABS: ORAL_TABLET | ORAL | Status: DC

## 2013-09-06 NOTE — Telephone Encounter (Signed)
Okay to change dose as above. Refill both for a year

## 2013-09-06 NOTE — Telephone Encounter (Signed)
I sent both scripts e-scribe. 

## 2013-10-01 ENCOUNTER — Telehealth: Payer: Self-pay | Admitting: Family Medicine

## 2013-10-01 MED ORDER — OXYBUTYNIN CHLORIDE 5 MG PO TABS: ORAL_TABLET | ORAL | Status: DC

## 2013-10-01 MED ORDER — FLUOXETINE HCL 40 MG PO CAPS: 80.0000 mg | ORAL_CAPSULE | Freq: Every day | ORAL | Status: DC

## 2013-10-01 NOTE — Telephone Encounter (Signed)
[id number] [place] [place] [address] is requesting 90 day re-fill on oxybutynin (DITROPAN) 5 MG tablet

## 2013-10-01 NOTE — Telephone Encounter (Signed)
Per Dr. Sarajane Jews okay to resend scripts for the 90 day supply, which I did.

## 2013-10-01 NOTE — Telephone Encounter (Signed)
Please add 90 day re-fill request of FLUoxetine (PROZAC) 40 MG capsule

## 2013-10-22 ENCOUNTER — Other Ambulatory Visit: Payer: Self-pay | Admitting: Family Medicine

## 2013-11-19 ENCOUNTER — Other Ambulatory Visit: Payer: Self-pay | Admitting: Family Medicine

## 2013-12-01 ENCOUNTER — Encounter: Payer: Self-pay | Admitting: Family Medicine

## 2013-12-01 ENCOUNTER — Ambulatory Visit (INDEPENDENT_AMBULATORY_CARE_PROVIDER_SITE_OTHER): Payer: 59 | Admitting: Family Medicine

## 2013-12-01 VITALS — BP 113/78 | HR 80 | Temp 99.0°F | Ht 63.0 in | Wt 150.0 lb

## 2013-12-01 DIAGNOSIS — I10 Essential (primary) hypertension: Secondary | ICD-10-CM

## 2013-12-01 DIAGNOSIS — J019 Acute sinusitis, unspecified: Secondary | ICD-10-CM

## 2013-12-01 MED ORDER — HYDROCHLOROTHIAZIDE 25 MG PO TABS: 25.0000 mg | ORAL_TABLET | Freq: Every day | ORAL | Status: DC

## 2013-12-01 MED ORDER — AMLODIPINE BESYLATE 5 MG PO TABS: 5.0000 mg | ORAL_TABLET | Freq: Every day | ORAL | Status: DC

## 2013-12-01 MED ORDER — AZITHROMYCIN 250 MG PO TABS: ORAL_TABLET | ORAL | Status: DC

## 2013-12-01 NOTE — Progress Notes (Signed)
   Subjective:    Patient ID: Kristine [name], female    DOB: 10-26-1965, 48 y.o.   MRN: [id number]  HPI Here for 2 things. First she has had 5 days of sinus pressure, PND, and coughing up green sputum. No fever. Also her BP has been well controlled and sometimes has been very low. She has lost some weight and is exercising.    Review of Systems  Constitutional: Negative.   HENT: Positive for congestion, postnasal drip and sinus pressure.   Eyes: Negative.   Respiratory: Positive for cough.   Cardiovascular: Negative.        Objective:   Physical Exam  Constitutional: She appears well-developed and well-nourished.  HENT:  Right Ear: External ear normal.  Left Ear: External ear normal.  Nose: Nose normal.  Mouth/Throat: Oropharynx is clear and moist.  Eyes: Conjunctivae are normal.  Cardiovascular: Normal rate, regular rhythm, normal heart sounds and intact distal pulses.   Pulmonary/Chest: Effort normal and breath sounds normal.  Lymphadenopathy:    She has no cervical adenopathy.          Assessment & Plan:  Treat the sinusitis with a Zpack. We can wean down on her BP meds, so we will eliminate the Valsartan. She will take amlodipine and HCTZ daily, and recheck in one month.

## 2013-12-01 NOTE — Progress Notes (Signed)
Pre visit review using our clinic review tool, if applicable. No additional management support is needed unless otherwise documented below in the visit note. 

## 2013-12-02 ENCOUNTER — Telehealth: Payer: Self-pay | Admitting: Family Medicine

## 2013-12-02 NOTE — Telephone Encounter (Signed)
emmi mailed  °

## 2014-02-25 ENCOUNTER — Emergency Department (HOSPITAL_COMMUNITY)
Admission: EM | Admit: 2014-02-25 | Discharge: 2014-02-26 | Disposition: A | Discharge status: Home / Self Care | Payer: 59 | Attending: Emergency Medicine | Admitting: Emergency Medicine

## 2014-02-25 ENCOUNTER — Encounter (HOSPITAL_COMMUNITY): Payer: Self-pay

## 2014-02-25 DIAGNOSIS — Z8619 Personal history of other infectious and parasitic diseases: Secondary | ICD-10-CM | POA: Insufficient documentation

## 2014-02-25 DIAGNOSIS — Z8719 Personal history of other diseases of the digestive system: Secondary | ICD-10-CM | POA: Insufficient documentation

## 2014-02-25 DIAGNOSIS — Z8742 Personal history of other diseases of the female genital tract: Secondary | ICD-10-CM | POA: Diagnosis not present

## 2014-02-25 DIAGNOSIS — R51 Headache: Secondary | ICD-10-CM | POA: Insufficient documentation

## 2014-02-25 DIAGNOSIS — Z8639 Personal history of other endocrine, nutritional and metabolic disease: Secondary | ICD-10-CM | POA: Insufficient documentation

## 2014-02-25 DIAGNOSIS — Z79899 Other long term (current) drug therapy: Secondary | ICD-10-CM | POA: Diagnosis not present

## 2014-02-25 DIAGNOSIS — Z720 Tobacco use: Secondary | ICD-10-CM | POA: Diagnosis not present

## 2014-02-25 DIAGNOSIS — I10 Essential (primary) hypertension: Secondary | ICD-10-CM | POA: Diagnosis not present

## 2014-02-25 DIAGNOSIS — R519 Headache, unspecified: Secondary | ICD-10-CM

## 2014-02-25 DIAGNOSIS — F329 Major depressive disorder, single episode, unspecified: Secondary | ICD-10-CM | POA: Diagnosis not present

## 2014-02-25 MED ORDER — KETOROLAC TROMETHAMINE 15 MG/ML IJ SOLN
15.0000 mg | Freq: Once | INTRAMUSCULAR | Status: AC
  Administered 2014-02-25: 15 mg via INTRAVENOUS
  Filled 2014-02-25: qty 1

## 2014-02-25 MED ORDER — PROCHLORPERAZINE EDISYLATE 5 MG/ML IJ SOLN
10.0000 mg | Freq: Four times a day (QID) | INTRAMUSCULAR | Status: DC | PRN
  Administered 2014-02-25: 10 mg via INTRAVENOUS
  Filled 2014-02-25: qty 2

## 2014-02-25 MED ORDER — DIPHENHYDRAMINE HCL 50 MG/ML IJ SOLN
25.0000 mg | Freq: Once | INTRAMUSCULAR | Status: AC
  Administered 2014-02-25: 25 mg via INTRAVENOUS
  Filled 2014-02-25: qty 1

## 2014-02-25 NOTE — ED Notes (Signed)
Pt c/o right side headache x 3 days states it is getting worse

## 2014-02-26 ENCOUNTER — Emergency Department (HOSPITAL_COMMUNITY): Payer: 59

## 2014-02-26 MED ORDER — HYDROMORPHONE HCL 1 MG/ML IJ SOLN
0.5000 mg | Freq: Once | INTRAMUSCULAR | Status: DC
  Filled 2014-02-26: qty 1

## 2014-02-26 MED ORDER — TRAMADOL HCL 50 MG PO TABS: 50.0000 mg | ORAL_TABLET | Freq: Four times a day (QID) | ORAL | Status: DC | PRN

## 2014-02-26 NOTE — ED Notes (Signed)
Pt. Left with all belongings 

## 2014-02-26 NOTE — ED Provider Notes (Signed)
CSN: [id number]     Arrival date & time 02/25/14  1902 History   First MD Initiated Contact with Patient 02/25/14 2140     Chief Complaint  Patient presents with  . Headache     (Consider location/radiation/quality/duration/timing/severity/associated sxs/prior Treatment) HPI   48 year old female with headache. Gradual onset approximately 3 days ago. Persistent since then. Pain is in the right side of her head had a right neck. Waxes and wanes but does not completely go away. No appreciable exacerbating relieving factors. Associated photophobia. No visual complaints otherwise. No fevers or chills.  Denies any significant past headache history. No blood thinners. Has been taking Tylenol with minimal relief.  Past Medical History  Diagnosis Date  . GERD (gastroesophageal reflux disease)   . Hyperlipidemia   . Hypertension   . Endometriosis   . Alcohol abuse   . Mononucleosis   . Pancreatitis   . Depression    Past Surgical History  Procedure Laterality Date  . Abdominal hysterectomy  2005  . Breast biopsy      benign  . Cesarean section    . Esophagogastroduodenoscopy     Family History  Problem Relation Age of Onset  . Arthritis    . Breast cancer    . Diabetes    . Hyperlipidemia    . Hypertension     History  Substance Use Topics  . Smoking status: Current Every Day Smoker -- 0.50 packs/day    Types: Cigarettes  . Smokeless tobacco: Never Used  . Alcohol Use: 0.5 oz/week    1 drink(s) per week     Comment: occ   OB History    No data available     Review of Systems  All systems reviewed and negative, other than as noted in HPI.   Allergies  Ciprofloxacin and Codeine  Home Medications   Prior to Admission medications   Medication Sig Start Date End Date Taking? Authorizing Provider  amLODipine (NORVASC) 5 MG tablet Take 1 tablet (5 mg total) by mouth daily. 12/01/13  Yes Laurey Morale, MD  FLUoxetine (PROZAC) 40 MG capsule Take 2 capsules (80 mg  total) by mouth daily. 10/01/13  Yes Laurey Morale, MD  HYDROcodone-acetaminophen (NORCO/VICODIN) 5-325 MG per tablet 1-2 tablets po q 6 hours prn moderate to severe pain 10/06/12  Yes Laurey Morale, MD  oxybutynin (DITROPAN) 5 MG tablet take 2 tablets three times a day 10/01/13  Yes Laurey Morale, MD  azithromycin [facility]) 250 MG tablet As directed Patient not taking: Reported on 02/25/2014 12/01/13   Laurey Morale, MD  hydrochlorothiazide (HYDRODIURIL) 25 MG tablet Take 1 tablet (25 mg total) by mouth daily. Patient not taking: Reported on 02/25/2014 12/01/13   Laurey Morale, MD   BP 168/96 mmHg  Pulse 64  Temp(Src) 98.4 F (36.9 C) (Oral)  Resp 18  Ht 5\' 2"  (1.575 m)  Wt 150 lb (68.04 kg)  BMI 27.43 kg/m2  SpO2 100% Physical Exam  Constitutional: She is oriented to person, place, and time. She appears well-developed and well-nourished. No distress.  HENT:  Head: Normocephalic and atraumatic.  Eyes: Conjunctivae and EOM are normal. Pupils are equal, round, and reactive to light. Right eye exhibits no discharge. Left eye exhibits no discharge.  Neck: Normal range of motion. Neck supple.  Cardiovascular: Normal rate, regular rhythm and normal heart sounds.  Exam reveals no gallop and no friction rub.   No murmur heard. Pulmonary/Chest: Effort normal and breath sounds normal.  No respiratory distress.  Abdominal: Soft. She exhibits no distension. There is no tenderness.  Musculoskeletal: She exhibits no edema or tenderness.  Lymphadenopathy:    She has no cervical adenopathy.  Neurological: She is alert and oriented to person, place, and time.  Speech clear. Content appropriate. Follows commands. Good finger to nose testing bilaterally.  Skin: Skin is warm and dry.  Psychiatric: She has a normal mood and affect. Her behavior is normal. Thought content normal.  Nursing note and vitals reviewed.   ED Course  Procedures (including critical care time) Labs Review Labs Reviewed - No  data to display  Imaging Review Ct Head Wo Contrast  02/26/2014   CLINICAL DATA:  Acute onset of headache for 3 days. Photosensitivity. Initial encounter.  EXAM: CT HEAD WITHOUT CONTRAST  TECHNIQUE: Contiguous axial images were obtained from the base of the skull through the vertex without intravenous contrast.  COMPARISON:  CT of the head performed 02/28/2008  FINDINGS: There is no evidence of acute infarction, mass lesion, or intra- or extra-axial hemorrhage on CT.  The posterior fossa, including the cerebellum, brainstem and fourth ventricle, is within normal limits. The third and lateral ventricles, and basal ganglia are unremarkable in appearance. The cerebral hemispheres are symmetric in appearance, with normal gray-white differentiation. No mass effect or midline shift is seen.  There is no evidence of fracture; visualized osseous structures are unremarkable in appearance. The orbits are within normal limits. The paranasal sinuses and mastoid air cells are well-aerated. No significant soft tissue abnormalities are seen.  IMPRESSION: Unremarkable noncontrast CT of the head.   Electronically Signed   By: Garald Balding M.D.   On: 02/26/2014 00:36     EKG Interpretation None      MDM   Final diagnoses:  HA (headache)   48yF with headache. Suspect primary HA. Consider emergent secondary causes such as bleed, infectious or mass but doubt. There is no history of trauma. Pt has a nonfocal neurological exam. Afebrile and neck supple. No use of blood thinning medication. Consider ocular etiology such as acute angle closure glaucoma but doubt. Pt denies acute change in visual acuity and eye exam unremarkable. Doubt temporal arteritis given age, no temporal tenderness and temporal artery pulsations palpable. Doubt CO poisoning. No contacts with similar symptoms. Doubt venous thrombosis. Doubt carotid or vertebral arteries dissection. Symptoms improved with meds. Feel that can be safely discharged, but  strict return precautions discussed. Outpt fu.     Virgel Manifold, MD 03/03/14 1340

## 2014-02-26 NOTE — Discharge Instructions (Signed)

## 2014-04-27 ENCOUNTER — Other Ambulatory Visit: Payer: Self-pay

## 2014-04-28 ENCOUNTER — Other Ambulatory Visit (INDEPENDENT_AMBULATORY_CARE_PROVIDER_SITE_OTHER): Payer: Self-pay

## 2014-04-28 DIAGNOSIS — Z0000 Encounter for general adult medical examination without abnormal findings: Secondary | ICD-10-CM

## 2014-04-28 LAB — HEPATIC FUNCTION PANEL
ALT: 19 U/L (ref 0–35)
AST: 18 U/L (ref 0–37)
Albumin: 4 g/dL (ref 3.5–5.2)
Alkaline Phosphatase: 58 U/L (ref 39–117)
Bilirubin, Direct: 0.1 mg/dL (ref 0.0–0.3)
Total Bilirubin: 0.5 mg/dL (ref 0.2–1.2)
Total Protein: 6.7 g/dL (ref 6.0–8.3)

## 2014-04-28 LAB — CBC WITH DIFFERENTIAL/PLATELET
Basophils Absolute: 0 10*3/uL (ref 0.0–0.1)
Basophils Relative: 0.8 % (ref 0.0–3.0)
Eosinophils Absolute: 0.1 10*3/uL (ref 0.0–0.7)
Eosinophils Relative: 2.8 % (ref 0.0–5.0)
HCT: 39.7 % (ref 36.0–46.0)
Hemoglobin: 13.6 g/dL (ref 12.0–15.0)
Lymphocytes Relative: 42.2 % (ref 12.0–46.0)
Lymphs Abs: 2 10*3/uL (ref 0.7–4.0)
MCHC: 34.2 g/dL (ref 30.0–36.0)
MCV: 88.8 fl (ref 78.0–100.0)
Monocytes Absolute: 0.6 10*3/uL (ref 0.1–1.0)
Monocytes Relative: 11.9 % (ref 3.0–12.0)
Neutro Abs: 2 10*3/uL (ref 1.4–7.7)
Neutrophils Relative %: 42.3 % — ABNORMAL LOW (ref 43.0–77.0)
Platelets: 231 10*3/uL (ref 150.0–400.0)
RBC: 4.47 Mil/uL (ref 3.87–5.11)
RDW: 15.2 % (ref 11.5–15.5)
WBC: 4.7 10*3/uL (ref 4.0–10.5)

## 2014-04-28 LAB — LIPID PANEL
Cholesterol: 249 mg/dL — ABNORMAL HIGH (ref 0–200)
HDL: 55 mg/dL (ref >39.00)
LDL Cholesterol: 177 mg/dL — ABNORMAL HIGH (ref 0–99)
NonHDL: 194
Total CHOL/HDL Ratio: 5
Triglycerides: 86 mg/dL (ref 0.0–149.0)
VLDL: 17.2 mg/dL (ref 0.0–40.0)

## 2014-04-28 LAB — POCT URINALYSIS DIPSTICK
Blood, UA: NEGATIVE
Glucose, UA: NEGATIVE
Ketones, UA: NEGATIVE
Nitrite, UA: NEGATIVE
Spec Grav, UA: 1.025
Urobilinogen, UA: 0.2
pH, UA: 5.5

## 2014-04-28 LAB — BASIC METABOLIC PANEL
BUN: 18 mg/dL (ref 6–23)
CO2: 28 mEq/L (ref 19–32)
Calcium: 8.8 mg/dL (ref 8.4–10.5)
Chloride: 109 mEq/L (ref 96–112)
Creatinine, Ser: 0.82 mg/dL (ref 0.40–1.20)
GFR: 94.75 mL/min (ref >60.00)
Glucose, Bld: 81 mg/dL (ref 70–99)
Potassium: 3.7 mEq/L (ref 3.5–5.1)
Sodium: 143 mEq/L (ref 135–145)

## 2014-04-28 LAB — TSH: TSH: 0.61 u[IU]/mL (ref 0.35–4.50)

## 2014-05-04 ENCOUNTER — Ambulatory Visit (INDEPENDENT_AMBULATORY_CARE_PROVIDER_SITE_OTHER): Payer: 59 | Admitting: Family Medicine

## 2014-05-04 ENCOUNTER — Encounter: Payer: Self-pay | Admitting: Family Medicine

## 2014-05-04 VITALS — BP 124/85 | HR 93 | Temp 98.4°F | Ht 62.0 in | Wt 154.0 lb

## 2014-05-04 DIAGNOSIS — I10 Essential (primary) hypertension: Secondary | ICD-10-CM

## 2014-05-04 MED ORDER — AMLODIPINE-VALSARTAN-HCTZ 5-160-25 MG PO TABS: 1.0000 | ORAL_TABLET | Freq: Every day | ORAL | Status: DC

## 2014-05-04 NOTE — Progress Notes (Signed)
   Subjective:    Patient ID: Kristine [name], female    DOB: March 24, 1965, 49 y.o.   MRN: [id number]  HPI Here to follow up on HTN. Her BP at home is stable and she feels well. Her recent labs were normal except for elevated lipids. She has been taking Amlodipine and HCTZ but she wants to go back to the combination med of these with valsartan because it would be cheaper for her.    Review of Systems  Constitutional: Negative.   Respiratory: Negative.   Cardiovascular: Negative.   Neurological: Negative.        Objective:   Physical Exam  Constitutional: She is oriented to person, place, and time. She appears well-developed and well-nourished.  Cardiovascular: Normal rate, regular rhythm, normal heart sounds and intact distal pulses.   Pulmonary/Chest: Effort normal and breath sounds normal.  Lymphadenopathy:    She has no cervical adenopathy.  Neurological: She is alert and oriented to person, place, and time.          Assessment & Plan:  She is doing well. We will change to amlodipine-valsartan-hctz. Recheck in 6 months

## 2014-05-04 NOTE — Progress Notes (Signed)
Pre visit review using our clinic review tool, if applicable. No additional management support is needed unless otherwise documented below in the visit note. 

## 2014-07-29 ENCOUNTER — Other Ambulatory Visit: Payer: Self-pay | Admitting: Family Medicine

## 2014-07-29 NOTE — Telephone Encounter (Signed)
Sent to the pharmacy for 3 months.  Pt should follow up in Sept 2016.

## 2014-08-24 ENCOUNTER — Telehealth: Payer: Self-pay | Admitting: Family Medicine

## 2014-08-24 DIAGNOSIS — N3281 Overactive bladder: Secondary | ICD-10-CM

## 2014-08-24 NOTE — Telephone Encounter (Signed)
Pt states the oxybutynin (DITROPAN) 5 MG tablet is not working. Pt states she has to go to the bathroom all the time.  It was working at first, but now it is not. Pt is concerned her bladder may have dropped. Pt in new [place] and cannot come in until next week.  Pt would like her med changed, something stronger, or does she need an appt?  If she does not answer, pls leave detailed message

## 2014-08-24 NOTE — Telephone Encounter (Signed)
Pt is already taking 10 mg three times a day. She can only come in to office this Friday and Dr. Sarajane Jews will be out of the office.

## 2014-08-25 NOTE — Telephone Encounter (Signed)
She needs to see Urology so I put in this referral. In the meantime stay on current meds

## 2014-08-25 NOTE — Telephone Encounter (Addendum)
Pt called back to fu. Pt aware of dr fry advise and that referral has been placed/ pt is very happy w/ that plan of action.  Pt looks forward to an appt w/ urology.

## 2014-08-26 NOTE — Telephone Encounter (Signed)
Noted  

## 2014-09-27 ENCOUNTER — Encounter: Payer: Self-pay | Admitting: Gastroenterology

## 2014-10-26 ENCOUNTER — Other Ambulatory Visit: Payer: Self-pay | Admitting: Family Medicine

## 2014-10-27 NOTE — Telephone Encounter (Signed)
Left a message on home/cell for a return call.  Referral to urology was placed in June.  Pt may not be taking this medication.  Will need to check with the pt to see what she is on.

## 2014-11-04 ENCOUNTER — Encounter: Payer: Self-pay | Admitting: Family Medicine

## 2014-11-04 ENCOUNTER — Ambulatory Visit (INDEPENDENT_AMBULATORY_CARE_PROVIDER_SITE_OTHER): Payer: 59 | Admitting: Family Medicine

## 2014-11-04 VITALS — BP 163/106 | HR 81 | Temp 98.3°F | Ht 62.0 in | Wt 158.0 lb

## 2014-11-04 DIAGNOSIS — M546 Pain in thoracic spine: Secondary | ICD-10-CM | POA: Diagnosis not present

## 2014-11-04 MED ORDER — AMLODIPINE BESYLATE 2.5 MG PO TABS: 2.5000 mg | ORAL_TABLET | Freq: Every day | ORAL | Status: DC

## 2014-11-04 MED ORDER — VALSARTAN 160 MG PO TABS: 160.0000 mg | ORAL_TABLET | Freq: Every day | ORAL | Status: DC

## 2014-11-04 MED ORDER — DICLOFENAC SODIUM 75 MG PO TBEC: 75.0000 mg | DELAYED_RELEASE_TABLET | Freq: Two times a day (BID) | ORAL | Status: DC

## 2014-11-04 MED ORDER — CYCLOBENZAPRINE HCL 10 MG PO TABS: 10.0000 mg | ORAL_TABLET | Freq: Three times a day (TID) | ORAL | Status: DC | PRN

## 2014-11-04 NOTE — Progress Notes (Signed)
Pre visit review using our clinic review tool, if applicable. No additional management support is needed unless otherwise documented below in the visit note. 

## 2014-11-04 NOTE — Progress Notes (Signed)
   Subjective:    Patient ID: Kristine [name], female    DOB: [birth year], 49 y.o.   MRN: [id number]  HPI Here for 4 weeks of a sharp burning pain in the left upper back under the shoulder blade that radiates down the left upper arm and up into the left neck. Her left hand feels numb at times. Using heat but she has not taken any medication for this. No hx of trauma.    Review of Systems  Constitutional: Negative.   Musculoskeletal: Positive for back pain and neck pain.  Neurological: Positive for numbness.       Objective:   Physical Exam  Constitutional: She is oriented to person, place, and time. She appears well-developed and well-nourished.  Cardiovascular: Normal rate, regular rhythm, normal heart sounds and intact distal pulses.   Pulmonary/Chest: Effort normal and breath sounds normal.  Musculoskeletal:  Tender in the left upper back between the scapula and spine. Full ROM of the neck and spine  Neurological: She is alert and oriented to person, place, and time.          Assessment & Plan:  Thoracic pain, likely from a pinched nerve root. She will use heat and stretches. Try Flexeril and Diclofenac. I also suggested she try a massage. Recheck prn

## 2014-11-12 ENCOUNTER — Other Ambulatory Visit: Payer: Self-pay | Admitting: Family Medicine

## 2014-12-03 ENCOUNTER — Emergency Department (INDEPENDENT_AMBULATORY_CARE_PROVIDER_SITE_OTHER)
Admission: EM | Admit: 2014-12-03 | Discharge: 2014-12-03 | Disposition: A | Discharge status: Home / Self Care | Payer: 59 | Source: Home / Self Care | Attending: Family Medicine | Admitting: Family Medicine

## 2014-12-03 ENCOUNTER — Encounter (HOSPITAL_COMMUNITY): Payer: Self-pay | Admitting: Emergency Medicine

## 2014-12-03 ENCOUNTER — Emergency Department (INDEPENDENT_AMBULATORY_CARE_PROVIDER_SITE_OTHER): Payer: 59

## 2014-12-03 DIAGNOSIS — Z23 Encounter for immunization: Secondary | ICD-10-CM

## 2014-12-03 DIAGNOSIS — L089 Local infection of the skin and subcutaneous tissue, unspecified: Secondary | ICD-10-CM

## 2014-12-03 DIAGNOSIS — T23221A Burn of second degree of single right finger (nail) except thumb, initial encounter: Secondary | ICD-10-CM

## 2014-12-03 DIAGNOSIS — T754XXA Electrocution, initial encounter: Secondary | ICD-10-CM

## 2014-12-03 MED ORDER — TETANUS-DIPHTH-ACELL PERTUSSIS 5-2.5-18.5 LF-MCG/0.5 IM SUSP
INTRAMUSCULAR | Status: AC
  Filled 2014-12-03: qty 0.5

## 2014-12-03 MED ORDER — HYDROCODONE-ACETAMINOPHEN 5-325 MG PO TABS: 1.0000 | ORAL_TABLET | ORAL | Status: DC | PRN

## 2014-12-03 MED ORDER — TETANUS-DIPHTH-ACELL PERTUSSIS 5-2.5-18.5 LF-MCG/0.5 IM SUSP
0.5000 mL | Freq: Once | INTRAMUSCULAR | Status: AC
  Administered 2014-12-03: 0.5 mL via INTRAMUSCULAR

## 2014-12-03 MED ORDER — LIDOCAINE HCL (PF) 1 % IJ SOLN
INTRAMUSCULAR | Status: AC
  Filled 2014-12-03: qty 5

## 2014-12-03 MED ORDER — CEFTRIAXONE SODIUM 1 G IJ SOLR
1.0000 g | Freq: Once | INTRAMUSCULAR | Status: AC
  Administered 2014-12-03: 1 g via INTRAMUSCULAR

## 2014-12-03 MED ORDER — HYDROCODONE-ACETAMINOPHEN 5-325 MG PO TABS
2.0000 | ORAL_TABLET | Freq: Once | ORAL | Status: AC
  Administered 2014-12-03: 2 via ORAL

## 2014-12-03 MED ORDER — BACITRACIN ZINC 500 UNIT/GM EX OINT
TOPICAL_OINTMENT | CUTANEOUS | Status: AC
  Filled 2014-12-03: qty 0.9

## 2014-12-03 MED ORDER — HYDROCODONE-ACETAMINOPHEN 5-325 MG PO TABS
ORAL_TABLET | ORAL | Status: AC
  Filled 2014-12-03: qty 2

## 2014-12-03 MED ORDER — CEFTRIAXONE SODIUM 1 G IJ SOLR
INTRAMUSCULAR | Status: AC
  Filled 2014-12-03: qty 10
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
## 2014-12-03 NOTE — Discharge Instructions (Signed)
Electrical Burn An electrical burn can cause damage to both the skin and deeper tissues. If an electrical burn is minor and only affects the skin, it can be treated like other burns. If deeper tissues (muscles, nerves, blood vessels) are injured, hospital care may be needed. Electrical burns may go deep into the body, but signs of a burn on the skin may be minimal. These deeper burns may affect the heart and other vital organ systems. Significant muscle damage or breakdown (rhabdomyolysis) can occur if enough electrical injury is sustained.This can cause muscular pain, fatigue, and spasms. It may also cause your urine to become very dark, because products from the muscle breakdown end up in the urine. If you have any of these symptoms, you must seek immediate medical care. Electrical injuries may also cause trauma due to falling or being "knocked back" by the electrical shock. This may result in significant injury to an extremity or to the head, resulting in loss of consciousness. The degree of damage is related to the type of current (alternating [AC] or direct [DC]) and the amount of voltage (low or high). Most electrical injuries in the home are low voltage AC injuries, but it is important to tell your caregiver how the electrical burn happened to help determine your treatment.Most electrical burns are not life-threatening, but cardiac or respiratory arrest can occur from electrical injuries. HOME CARE INSTRUCTIONS  Keep the burn area clean and dry.  Use burn dressings, burn creams, and ointments only as directed by your caregiver. Dressings may be changed once or twice each day or as directed.  Rest the burn area and keep it raised (elevated) to reduce swelling and pain.  Take pain medicine as directed by your caregiver. SEEK IMMEDIATE MEDICAL CARE IF:   You develop a fever, redness, or pus drainage.  You develop chest pain or shortness of breath.  You develop new numbness or weakness in  any of your extremities.  You have significant pain and swelling in any of your extremities.  Your urine becomes very dark, and you have more pain, fatigue, and muscle spasms. MAKE SURE YOU:  Understand these instructions.  Will watch your condition.  Will get help right away if you are not doing well or get worse. Document Released: 03/28/2004 Document Revised: 05/13/2011 Document Reviewed: 04/06/2010 [facility] Patient Information 2015 [place], [place]. This information is not intended to replace advice given to you by your health care provider. Make sure you discuss any questions you have with your health care provider.    Burn Care Your skin is a natural barrier to infection. It is the largest organ of your body. Burns damage this natural protection. To help prevent infection, it is very important to follow your caregiver's instructions in the care of your burn. Burns are classified as:  First degree. There is only redness of the skin (erythema). No scarring is expected.  Second degree. There is blistering of the skin. Scarring may occur with deeper burns.  Third degree. All layers of the skin are injured, and scarring is expected. HOME CARE INSTRUCTIONS   Wash your hands well before changing your bandage.  Change your bandage as often as directed by your caregiver.  Remove the old bandage. If the bandage sticks, you may soak it off with cool, clean water.  Cleanse the burn thoroughly but gently with mild soap and water.  Pat the area dry with a clean, dry cloth.  Apply a thin layer of antibacterial cream to the burn.  Apply a clean bandage as instructed by your caregiver.  Keep the bandage as clean and dry as possible.  Elevate the affected area for the first 24 hours, then as instructed by your caregiver.  Only take over-the-counter or prescription medicines for pain, discomfort, or fever as directed by your caregiver. SEEK IMMEDIATE MEDICAL CARE IF:   You develop  excessive pain.  You develop redness, tenderness, swelling, or red streaks near the burn.  The burned area develops yellowish-white fluid (pus) or a bad smell.  You have a fever. MAKE SURE YOU:   Understand these instructions.  Will watch your condition.  Will get help right away if you are not doing well or get worse. Document Released: 02/18/2005 Document Revised: 05/13/2011 Document Reviewed: 07/11/2010 [facility] Patient Information 2015 [place], [place]. This information is not intended to replace advice given to you by your health care provider. Make sure you discuss any questions you have with your health care provider.  Electric Shock Injury Electric shock injuries may be caused by lightning or electricity (current) passing through the body. The amount of injury depends on the current's pressure (voltage), the amount of current (amperage), the type of current (direct vs. alternating), the body's resistance to the current, the current's path through the body, and how long the body remains in contact with the current. Current is the flow of electricity. Electricity may produce effects ranging from barely noticeable tingling to instant death; every part of the body is vulnerable.  The harshness of injury depends mostly on the voltage. Low voltage can be as dangerous as high voltage under the right circumstances. People have been killed by shocks of just 50 volts. WHAT DETERMINES THE EFFECTS OF ELECTRICITY? How electric shocks affect the skin is determined by the skin's resistance. This is the skin's ability to stay unharmed by a shock. This, in turn, depends upon the wetness, dryness, thickness and or cleanliness of the skin. Thin or wet skin is much less resistant than thick or dry skin. When skin resistance is low, the current may cause little or no skin damage but may severely burn internal organs and tissues. Conversely, high skin resistance, such as with dry thick skin, can produce severe  skin burns but decreases the current entering the body. WHAT PARTS OF THE BODY DOES ELECTRICITY AFFECT THE MOST?  The nervous system (the brain, spinal cord, and nerves) are most helpless to the effects of electricity and most often harmed in electrical injury. Some damage is minor and clears up on its own or with treatment. Sometimes the damage is severe and will be permanent. Neurological problems may be apparent immediately after the accident, or gradually develop over a period of up to three years.  Damage to the respiratory and cardiovascular systems happens immediately. Electric shocks can paralyze the respiratory system (stop breathing) or disrupt heart action (cause the heart to beat irregularly or stop). This may cause instant death. Smaller veins and arteries, which get hot more easily than the larger blood vessels, are at greater risk. They can develop blood clots. Damage to the smaller vessels is a common cause of amputation following high-voltage injuries.  Other injuries may include cataracts, kidney failure, and injury to muscle tissue. An electric arc may set clothing and flammable substances on fire which may cause burns. Strong shocks are often accompanied by violent muscle spasms that can break and dislocate bones. These spasms can also freeze the victim in place and prevent him or her from breaking away from  the current. DIAGNOSIS  Diagnosis relies on information about the cause of the accident, physical examination, and close monitoring of the heart, lungs, neurological condition and kidney activity. These conditions can change rapidly so close observation is necessary. Magnetic resonance imaging (MRI) may be necessary to check for brain injury. TREATMENT   When an electrical accident happens at home or in the workplace, emergency medical help should be summoned as quickly as possible. The main power should immediately be shut off. If that cannot be done, and current is still flowing  through the victim, stand on a dry, non-conducting surface such as a folded newspaper, flattened cardboard carton, or plastic or rubber mat. Use a non-conducting object such as a wooden broomstick (never a damp or metallic object) to push the victim away from the source of the current. Non-conducting means the substance will not pass electricity easily through it. Do not touch the victim or electrical source while the current is still flowing. This may electrocute the rescuer.  If the victim is faint, pale, or showing signs of shock, lay the victim down, with the legs elevated above the level of the chest. Warm the person with a blanket.  If a pulse can not be felt, or the person is not breathing, someone trained in cardiopulmonary resuscitation (CPR) should begin CPR. Continue this until help arrives.  If the victim is burned, remove clothing that comes off easily. Rinse the burned area in cool water for pain relief. Give first aid for burns. Burns often require treatment at a burn center.  Electrical injury can be associated with explosions or falls that can cause other injuries. Avoid moving the head or neck if an injury to this area is suspected.  Give first aid as needed for other wounds or fractures.  Fluid replacement therapy is necessary to restore lost fluids and electrolytes. Severely injured tissue is repaired surgically.  Antibiotics and antibacterial creams are used to prevent infection.  Kidney failure may need to be treated.  Physical therapy may help recovery along with counseling if there is disfigurement. PROGNOSIS   Electric shocks may cause death.  Survivors may require amputation. Cosmetic problems may result along with disfigurement.  Injuries from household appliances and other low-voltage sources are less likely to produce extreme damage. PREVENTION   Know electrical dangers in your home.  Damaged electric appliances, wiring, cords, and plugs should be repaired  or replaced. Electrical repairs should be attempted only by people with the proper training.  Hair dryers, radios, and other electric appliances should never be used in the bathroom or anywhere else they might accidentally come in contact with water. Water and pipes create a ground and the electricity picks the easiest way to go to ground which can be through your body.  Young children need to be kept away from electric appliances and should be taught about the dangers of electricity as soon as they are old enough.  Electric outlets require safety covers in homes with young children.  During lightning and thunder storms, go indoors immediately, even if no rain is falling. Boaters should return to shore as rapidly as possible.  If the hair on your head or arms stands on end during a storm, seek cover as rapidly as possible as a lightning strike may be about to happen.  If you cannot reach indoor shelter, stay away from metallic objects such as golf clubs or fishing rods and lie down in low-ground areas. Standing or lying under or next to tall  or metallic structures is unsafe. For example, it is unsafe to stand under a tree during a lightning storm. Do not stand next to long conductors of electricity such as wire fences.  An automobile is appropriate cover, as long as the radio is off.  Telephones, computers, hair dryers, and other appliances that can act as channels for lightning should not be used during a thunder storm.  During storms, stay away from screens and metal that may conduct electricity from the outside. SEEK IMMEDIATE MEDICAL CARE IF:  You develop chest pain.  A part of your arms or legs becomes very swollen or painful.  One of your arms or legs appears pale, cool, or discolored.  Your urine becomes discolored, or you are not urinating as much as usual.  You develop severe abdominal pain. Document Released: 02/21/2003 Document Revised: 05/13/2011 Document Reviewed:  05/17/2013 [facility] Patient Information 2015 [place], [place]. This information is not intended to replace advice given to you by your health care provider. Make sure you discuss any questions you have with your health care provider.

## 2014-12-03 NOTE — ED Provider Notes (Signed)
CSN: [id number]     Arrival date & time 12/03/14  1656 History   First MD Initiated Contact with Patient 12/03/14 1729     Chief Complaint  Patient presents with  . 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 This was a DC battery. She was wearing a ring on that finger and beneath the ring it caused a burn. This is located at the PIP joint. She presents with pain, tenderness , local swelling and some skin breakdown. She describes scant purulent drainage. There is mild swelling and light erythema to the digit. Denies systemic symptoms or injury.   Past Medical History  Diagnosis Date  . GERD (gastroesophageal reflux disease)   . Hyperlipidemia   . Hypertension   . Endometriosis   . Alcohol abuse   . Mononucleosis   . Pancreatitis   . Depression    Past Surgical History  Procedure Laterality Date  . Abdominal hysterectomy  2005  . Breast biopsy      benign  . Cesarean section    . Esophagogastroduodenoscopy     Family History  Problem Relation Age of Onset  . Arthritis    . Breast cancer    . Diabetes    . Hyperlipidemia    . Hypertension     Social History  Substance Use Topics  . Smoking status: Current Every Day Smoker -- 0.50 packs/day    Types: Cigarettes  . Smokeless tobacco: Never Used  . Alcohol Use: 0.6 oz/week    1 Standard drinks or equivalent per week     Comment: occ   OB History    No data available     Review of Systems  Constitutional: Negative.   HENT: Negative.   Respiratory: Negative.   Cardiovascular: Negative for chest pain and palpitations.  Gastrointestinal: Negative.   Skin: Positive for wound.  Neurological: Negative.     Allergies  Ciprofloxacin and Codeine  Home Medications   Prior to Admission medications   Medication Sig  Start Date End Date Taking? Authorizing Provider  amLODipine (NORVASC) 2.5 MG tablet Take 1 tablet (2.5 mg total) by mouth daily. 11/04/14  Yes Laurey Morale, MD  FLUoxetine (PROZAC) 40 MG capsule TAKE 2 CAPSULES BY MOUTH EVERY DAY 11/15/14  Yes Laurey Morale, MD  oxybutynin (DITROPAN) 5 MG tablet TAKE 2 TABLETS BY MOUTH 3 TIMES A DAY 10/28/14  Yes Laurey Morale, MD  cyclobenzaprine (FLEXERIL) 10 MG tablet Take 1 tablet (10 mg total) by mouth 3 (three) times daily as needed for muscle spasms. 11/04/14   Laurey Morale, MD  diclofenac (VOLTAREN) 75 MG EC tablet Take 1 tablet (75 mg total) by mouth 2 (two) times daily. 11/04/14   Laurey Morale, MD  HYDROcodone-acetaminophen (NORCO/VICODIN) 5-325 MG per tablet 1-2 tablets po q 6 hours prn moderate to severe pain Patient not taking: Reported on 05/04/2014 10/06/12   Laurey Morale, MD  oxybutynin (DITROPAN) 5 MG tablet take 2 tablets three times a day 10/01/13   Laurey Morale, MD  traMADol (ULTRAM) 50 MG tablet Take 1 tablet (50 mg total) by mouth every 6 (six) hours as needed. 02/26/14   Virgel Manifold, MD  valsartan (DIOVAN) 160 MG tablet Take 1 tablet (160 mg total) by mouth daily. 11/04/14   Laurey Morale, MD  Medications  cefTRIAXone (ROCEPHIN) injection 1 g (1 g Intramuscular Given 12/03/14 1856)  Tdap (BOOSTRIX) injection 0.5 mL (0.5 mLs Intramuscular Given 12/03/14 1855)    BP 161/89 mmHg  Pulse 77  Temp(Src) 98.6 F (37 C) (Oral)  Resp 18  SpO2 99% No data found.   Physical Exam  Constitutional: She is oriented to person, place, and time. She appears well-developed and well-nourished. No distress.  HENT:  Head: Normocephalic and atraumatic.  Neck: Normal range of motion. Neck supple.  Cardiovascular: Normal rate and regular rhythm.   Pulmonary/Chest: Effort normal. No respiratory distress.  Musculoskeletal:  Right index finger with swelling from the second phalanx to the MCP. Minor erythema. There is constriction skin injury from where  the ring was in contact with the finger at the crease of the PIP. Minor redness to the finger. The skin was apparently burned to partial thickness. Much of this area is moist and in a narrow band. No current drainage seen by the examiner. Flexion approx 80% of full. Extension is 180 deg.  Neurological: She is alert and oriented to person, place, and time. She exhibits normal muscle tone.  Skin: Skin is warm and dry.  Psychiatric: She has a normal mood and affect.  Nursing note and vitals reviewed.   ED Course  Procedures (including critical care time)  Labs Review Labs Reviewed - No data to display  Imaging Review Dg Finger Index Right  12/03/2014   CLINICAL DATA:  Acute electrical burn index finger. Initial encounter.  EXAM: RIGHT INDEX FINGER 2+V  COMPARISON:  None.  FINDINGS: There is no evidence of fracture or dislocation. There is no evidence of arthropathy or other focal bone abnormality. Mild soft tissue swelling is noted.  IMPRESSION: Soft tissue swelling without other significant abnormality.   Electronically Signed   By: Margarette Canada M.D.   On: 12/03/2014 19:28     Visual Acuity Review  Right Eye Distance:   Left Eye Distance:   Bilateral Distance:    Right Eye Near:   Left Eye Near:    Bilateral Near:         MDM   1. Burn of finger, right, second degree, initial encounter   2. Infected finger   3. Electrical shock of hand, initial encounter    Wound soak in betadine sol'n, then jet spray with saline wash.    Spoke with Dr. Grandville Silos at 2010h. He will come to see the pt norco 10 mg po now and Rx for 5 mg #12 Bacitracin and light dressing. F/U with Dr. Grandville Silos as instructed  Janne Napoleon, NP 12/03/14 2049

## 2014-12-03 NOTE — ED Notes (Signed)
Waiting on hand specialist

## 2014-12-13 ENCOUNTER — Other Ambulatory Visit: Payer: Self-pay | Admitting: Family Medicine

## 2014-12-13 NOTE — Telephone Encounter (Signed)
I do not see this on current medication list. Can we refill?

## 2015-06-14 ENCOUNTER — Telehealth: Payer: Self-pay | Admitting: Family Medicine

## 2015-06-14 NOTE — Telephone Encounter (Signed)
Noted  

## 2015-06-14 NOTE — Telephone Encounter (Signed)
Pt is now using [employer]

## 2015-12-04 ENCOUNTER — Other Ambulatory Visit: Payer: Self-pay | Admitting: Family Medicine

## 2016-04-05 ENCOUNTER — Encounter: Payer: Self-pay | Admitting: Family Medicine

## 2016-04-05 ENCOUNTER — Ambulatory Visit (INDEPENDENT_AMBULATORY_CARE_PROVIDER_SITE_OTHER): Payer: 59 | Admitting: Family Medicine

## 2016-04-05 VITALS — BP 162/102 | HR 74 | Temp 98.2°F | Ht 62.0 in | Wt 169.0 lb

## 2016-04-05 DIAGNOSIS — I10 Essential (primary) hypertension: Secondary | ICD-10-CM

## 2016-04-05 LAB — CBC WITH DIFFERENTIAL/PLATELET
Basophils Absolute: 0 10*3/uL (ref 0.0–0.1)
Basophils Relative: 0.9 % (ref 0.0–3.0)
Eosinophils Absolute: 0.1 10*3/uL (ref 0.0–0.7)
Eosinophils Relative: 2.4 % (ref 0.0–5.0)
HCT: 41 % (ref 36.0–46.0)
Hemoglobin: 13.8 g/dL (ref 12.0–15.0)
Lymphocytes Relative: 46.5 % — ABNORMAL HIGH (ref 12.0–46.0)
Lymphs Abs: 1.8 10*3/uL (ref 0.7–4.0)
MCHC: 33.6 g/dL (ref 30.0–36.0)
MCV: 90.5 fl (ref 78.0–100.0)
Monocytes Absolute: 0.5 10*3/uL (ref 0.1–1.0)
Monocytes Relative: 12.3 % — ABNORMAL HIGH (ref 3.0–12.0)
Neutro Abs: 1.4 10*3/uL (ref 1.4–7.7)
Neutrophils Relative %: 37.9 % — ABNORMAL LOW (ref 43.0–77.0)
Platelets: 217 10*3/uL (ref 150.0–400.0)
RBC: 4.53 Mil/uL (ref 3.87–5.11)
RDW: 15.2 % (ref 11.5–15.5)
WBC: 3.8 10*3/uL — ABNORMAL LOW (ref 4.0–10.5)

## 2016-04-05 LAB — POC URINALSYSI DIPSTICK (AUTOMATED)
Bilirubin, UA: NEGATIVE
Blood, UA: NEGATIVE
Glucose, UA: NEGATIVE
Ketones, UA: NEGATIVE
Leukocytes, UA: NEGATIVE
Nitrite, UA: NEGATIVE
Protein, UA: NEGATIVE
Spec Grav, UA: 1.02
Urobilinogen, UA: 0.2
pH, UA: 7

## 2016-04-05 LAB — TSH: TSH: 1.18 u[IU]/mL (ref 0.35–4.50)

## 2016-04-05 LAB — BASIC METABOLIC PANEL
BUN: 14 mg/dL (ref 6–23)
CO2: 29 mEq/L (ref 19–32)
Calcium: 8.9 mg/dL (ref 8.4–10.5)
Chloride: 106 mEq/L (ref 96–112)
Creatinine, Ser: 0.78 mg/dL (ref 0.40–1.20)
GFR: 99.61 mL/min (ref >60.00)
Glucose, Bld: 90 mg/dL (ref 70–99)
Potassium: 3.9 mEq/L (ref 3.5–5.1)
Sodium: 141 mEq/L (ref 135–145)

## 2016-04-05 LAB — HEPATIC FUNCTION PANEL
ALT: 16 U/L (ref 0–35)
AST: 16 U/L (ref 0–37)
Albumin: 3.9 g/dL (ref 3.5–5.2)
Alkaline Phosphatase: 73 U/L (ref 39–117)
Bilirubin, Direct: 0.1 mg/dL (ref 0.0–0.3)
Total Bilirubin: 0.7 mg/dL (ref 0.2–1.2)
Total Protein: 6.4 g/dL (ref 6.0–8.3)

## 2016-04-05 LAB — LIPID PANEL
Cholesterol: 253 mg/dL — ABNORMAL HIGH (ref 0–200)
HDL: 62.5 mg/dL (ref >39.00)
LDL Cholesterol: 173 mg/dL — ABNORMAL HIGH (ref 0–99)
NonHDL: 190.85
Total CHOL/HDL Ratio: 4
Triglycerides: 88 mg/dL (ref 0.0–149.0)
VLDL: 17.6 mg/dL (ref 0.0–40.0)

## 2016-04-05 MED ORDER — AMLODIPINE BESYLATE-VALSARTAN 5-160 MG PO TABS: 1.0000 | ORAL_TABLET | Freq: Every day | ORAL | 3 refills | Status: DC

## 2016-04-05 NOTE — Progress Notes (Signed)
   Subjective:    Patient ID: Kristine [name], female    DOB: Nov 22, 1965, 51 y.o.   MRN: [id number]  HPI Here to follow up on HTN. She stopped taking her BP meds over a year ago simply because she "got tired of taking pills". Of course her BP has been running high for months, but lately she has been getting systolic readings in the 123456 and 170s. She feels tired, she gets SOB, and she has headaches. No chest pains.    Review of Systems  Constitutional: Negative.   Respiratory: Positive for shortness of breath. Negative for cough, chest tightness and wheezing.   Cardiovascular: Negative.   Gastrointestinal: Negative.   Neurological: Positive for headaches. Negative for dizziness, syncope and light-headedness.       Objective:   Physical Exam  Constitutional: She is oriented to person, place, and time. She appears well-developed and well-nourished. No distress.  Neck: No thyromegaly present.  Cardiovascular: Normal rate, regular rhythm, normal heart sounds and intact distal pulses.   Pulmonary/Chest: Effort normal and breath sounds normal. No respiratory distress. She has no wheezes. She has no rales.  Musculoskeletal: She exhibits no edema.  Lymphadenopathy:    She has no cervical adenopathy.  Neurological: She is alert and oriented to person, place, and time.          Assessment & Plan:  Her HTN is out of control. She needs a few days to rest so we will write her out of work today through 04-10-16. She did not tolerate taking HCTZ well before so we will get her on a combination of Amlodipine and Valsartan. Recheck here in 3 weeks. Get labs today to check renal function, etc. We spent a total of 40 minutes face to face discussing the importance of treating HTN and what the outcomes could be if this is not properly treated.  Alysia Penna, MD

## 2016-04-26 ENCOUNTER — Encounter: Payer: Self-pay | Admitting: Family Medicine

## 2016-04-26 ENCOUNTER — Ambulatory Visit (INDEPENDENT_AMBULATORY_CARE_PROVIDER_SITE_OTHER): Payer: 59 | Admitting: Family Medicine

## 2016-04-26 VITALS — BP 152/82 | Temp 98.0°F | Ht 62.0 in | Wt 170.8 lb

## 2016-04-26 DIAGNOSIS — I10 Essential (primary) hypertension: Secondary | ICD-10-CM | POA: Diagnosis not present

## 2016-04-26 MED ORDER — AMLODIPINE BESYLATE-VALSARTAN 10-160 MG PO TABS: 1.0000 | ORAL_TABLET | Freq: Every day | ORAL | 3 refills | Status: DC

## 2016-04-26 NOTE — Progress Notes (Signed)
   Subjective:    Patient ID: Kristine [name], female    DOB: 06-Sep-1965, 51 y.o.   MRN: [id number]  HPI Here to recheck her BP. She was here on 04-05-16 with a BP of 162/102 because she had stopped her meds. We started her on Exforge and now she feels much better. No fatigue or headache or SOB. Her BP at home has been in the 150s over 80s. Her labs that day were normal except for elevated lipids.   Review of Systems  Constitutional: Negative.   Respiratory: Negative.   Cardiovascular: Negative.   Neurological: Negative.        Objective:   Physical Exam  Constitutional: She is oriented to person, place, and time. She appears well-developed and well-nourished.  Cardiovascular: Normal rate, regular rhythm, normal heart sounds and intact distal pulses.   Pulmonary/Chest: Effort normal and breath sounds normal.  Neurological: She is alert and oriented to person, place, and time.          Assessment & Plan:  HTN, we will increase the Exforge to 10/160 to take daily. Recheck prn. She will continue to monitor this daily at home. Alysia Penna, MD

## 2016-08-06 ENCOUNTER — Telehealth: Payer: Self-pay | Admitting: Family Medicine

## 2016-08-06 NOTE — Telephone Encounter (Signed)
Spoke with pt and she reports "bright red blood" when she has a bowel movement. This has been happening for several days and seems to be increasing. She denies any hx of hemorrhoids. She does report some lower back pain that radiates to her left side. She is unsure about fever. She denies any n/v or black, tarry stools. She states that she is on her way to [place] and cannot stop to be seen anywhere. She will be flying back to [facility] tomorrow night and can come in on Thu at the earliest. Advised pt that this is a serious matter and likely cannot wait until Thu for evaluation. Pt is insistent that this is the only time she can come in. Advised pt that if bleeding continues, fever develops or abdominal pain increases she needs to be seen at any local medical facility immediately. Pt agrees and voiced understanding.   Dr. Sarajane Jews - FYI. Thanks!

## 2016-08-06 NOTE — Telephone Encounter (Addendum)
Pt states she has blood in her stool. Pt states it is getting worse. Pt states it started out a little blood on Sunday and this am was all blood in her stool. Pt reports some pain. Pt states she is "hot". Not sure if its a fever. 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  Pt states no way she can go to a doctor before then.

## 2016-08-07 NOTE — Telephone Encounter (Signed)
Kristine [name] pt is calling would like to have a call back on a personal matter.

## 2016-08-07 NOTE — Telephone Encounter (Signed)
Pt is on schedule to see Dr. Sarajane Jews in the morning 08/08/2016.

## 2016-08-08 ENCOUNTER — Ambulatory Visit (INDEPENDENT_AMBULATORY_CARE_PROVIDER_SITE_OTHER): Payer: BLUE CROSS/BLUE SHIELD | Admitting: Family Medicine

## 2016-08-08 ENCOUNTER — Encounter: Payer: Self-pay | Admitting: Family Medicine

## 2016-08-08 ENCOUNTER — Encounter: Payer: Self-pay | Admitting: Gastroenterology

## 2016-08-08 VITALS — BP 151/99 | HR 72 | Temp 98.3°F | Ht 62.0 in | Wt 166.0 lb

## 2016-08-08 DIAGNOSIS — K625 Hemorrhage of anus and rectum: Secondary | ICD-10-CM

## 2016-08-08 DIAGNOSIS — R1032 Left lower quadrant pain: Secondary | ICD-10-CM | POA: Diagnosis not present

## 2016-08-08 MED ORDER — METRONIDAZOLE 500 MG PO TABS: 500.0000 mg | ORAL_TABLET | Freq: Three times a day (TID) | ORAL | 0 refills | Status: DC

## 2016-08-08 MED ORDER — AMOXICILLIN-POT CLAVULANATE 875-125 MG PO TABS: 1.0000 | ORAL_TABLET | Freq: Two times a day (BID) | ORAL | 0 refills | Status: DC

## 2016-08-08 NOTE — Progress Notes (Signed)
   Subjective:    Patient ID: Kristine [name], female    DOB: May 01, 1965, 51 y.o.   MRN: [id number]  HPI Here for 3 days of intermittent pain in the left flank and LLQ. Her BMs have been easy to pass but no diarrhea. She has passed some bright red blood per rectum several times. She has had some low grade fevers. She has some nausea but has not vomited.    Review of Systems  Constitutional: Positive for fever.  Respiratory: Negative.   Cardiovascular: Negative.   Gastrointestinal: Positive for abdominal pain, anal bleeding and nausea. Negative for abdominal distention, constipation, diarrhea, rectal pain and vomiting.  Genitourinary: Negative.        Objective:   Physical Exam  Constitutional: She appears well-developed. No distress.  Cardiovascular: Normal rate, regular rhythm, normal heart sounds and intact distal pulses.   Pulmonary/Chest: Effort normal and breath sounds normal. No respiratory distress. She has no wheezes. She has no rales.  Abdominal: Soft. Bowel sounds are normal. She exhibits no distension and no mass. There is no rebound and no guarding.  Tender in the left flank and LLQ           Assessment & Plan:  Diverticulitis, treat with Augmentin and Flagyl. Refer to GI for a colonoscopy after this resolves.  Alysia Penna, MD

## 2016-08-08 NOTE — Telephone Encounter (Signed)
Pt was seen here in the office today

## 2016-08-08 NOTE — Patient Instructions (Signed)
WE NOW OFFER   [facility] FAST TRACK!!!  SAME DAY Appointments for ACUTE CARE  Such as: Sprains, Injuries, cuts, abrasions, rashes, muscle pain, joint pain, back pain Colds, flu, sore throats, headache, allergies, cough, fever  Ear pain, sinus and eye infections Abdominal pain, nausea, vomiting, diarrhea, upset stomach Animal/insect bites  3 Easy Ways to Schedule: Walk-In Scheduling Call in scheduling Mychart Sign-up: [email or web address]         

## 2016-09-26 ENCOUNTER — Ambulatory Visit (AMBULATORY_SURGERY_CENTER): Payer: Self-pay | Admitting: *Deleted

## 2016-09-26 VITALS — Ht 63.0 in | Wt 166.0 lb

## 2016-09-26 DIAGNOSIS — Z1211 Encounter for screening for malignant neoplasm of colon: Secondary | ICD-10-CM

## 2016-09-26 MED ORDER — NA SULFATE-K SULFATE-MG SULF 17.5-3.13-1.6 GM/177ML PO SOLN: 1.0000 | Freq: Once | ORAL | 0 refills | Status: AC

## 2016-09-26 NOTE — Progress Notes (Signed)
Denies allergies to eggs or soy products. Denies complications with sedation or anesthesia. Denies O2 use. Denies use of diet or weight loss medications.  Emmi instructions given for colonoscopy.  

## 2016-09-27 ENCOUNTER — Encounter: Payer: Self-pay | Admitting: Gastroenterology

## 2016-10-09 ENCOUNTER — Encounter: Payer: Self-pay | Admitting: Gastroenterology

## 2016-10-09 ENCOUNTER — Ambulatory Visit (AMBULATORY_SURGERY_CENTER): Payer: BLUE CROSS/BLUE SHIELD | Admitting: Gastroenterology

## 2016-10-09 VITALS — BP 124/76 | HR 64 | Temp 98.0°F | Resp 16 | Ht 63.0 in | Wt 166.0 lb

## 2016-10-09 DIAGNOSIS — D120 Benign neoplasm of cecum: Secondary | ICD-10-CM

## 2016-10-09 DIAGNOSIS — D125 Benign neoplasm of sigmoid colon: Secondary | ICD-10-CM

## 2016-10-09 DIAGNOSIS — D122 Benign neoplasm of ascending colon: Secondary | ICD-10-CM

## 2016-10-09 DIAGNOSIS — Z1212 Encounter for screening for malignant neoplasm of rectum: Secondary | ICD-10-CM

## 2016-10-09 DIAGNOSIS — Z1211 Encounter for screening for malignant neoplasm of colon: Secondary | ICD-10-CM

## 2016-10-09 HISTORY — PX: COLONOSCOPY: SHX174

## 2016-10-09 MED ORDER — SODIUM CHLORIDE 0.9 % IV SOLN: 500.0000 mL | INTRAVENOUS | Status: DC

## 2016-10-09 NOTE — Op Note (Signed)
[facility] Patient Name: Kristine [name] Procedure Date: 10/09/2016 11:59 AM MRN: [id number] Endoscopist: Mauri Pole , MD Age: 51 Referring MD:  Date of Birth: 1965/05/15 Gender: Female Account #: [id number] Procedure:                Colonoscopy Indications:              Screening for colorectal malignant neoplasm, This                            is the patient's first colonoscopy Medicines:                Monitored Anesthesia Care Procedure:                Pre-Anesthesia Assessment:                           - Prior to the procedure, a History and Physical                            was performed, and patient medications and                            allergies were reviewed. The patient's tolerance of                            previous anesthesia was also reviewed. The risks                            and benefits of the procedure and the sedation                            options and risks were discussed with the patient.                            All questions were answered, and informed consent                            was obtained. Prior Anticoagulants: The patient has                            taken no previous anticoagulant or antiplatelet                            agents. ASA Grade Assessment: II - A patient with                            mild systemic disease. After reviewing the risks                            and benefits, the patient was deemed in                            satisfactory condition to undergo the procedure.  After obtaining informed consent, the colonoscope                            was passed under direct vision. Throughout the                            procedure, the patient's blood pressure, pulse, and                            oxygen saturations were monitored continuously. The                            Model PCF-[id number]) scope was introduced                            through the anus  and advanced to the the cecum,                            identified by appendiceal orifice and ileocecal                            valve. The colonoscopy was performed without                            difficulty. The patient tolerated the procedure                            well. The quality of the bowel preparation was                            excellent. The ileocecal valve, appendiceal                            orifice, and rectum were photographed. Scope In: 12:07:44 PM Scope Out: 12:24:26 PM Scope Withdrawal Time: 0 hours 11 minutes 52 seconds  Total Procedure Duration: 0 hours 16 minutes 42 seconds  Findings:                 The perianal and digital rectal examinations were                            normal.                           A 1 mm polyp was found in the cecum. The polyp was                            sessile. The polyp was removed with a cold biopsy                            forceps. Resection and retrieval were complete.                           Two sessile polyps were found in the sigmoid colon  and ascending colon. The polyps were 4 to 5 mm in                            size. These polyps were removed with a cold snare.                            Resection and retrieval were complete.                           Non-bleeding internal hemorrhoids were found during                            retroflexion. The hemorrhoids were small.                           The exam was otherwise without abnormality. Complications:            No immediate complications. Estimated Blood Loss:     Estimated blood loss was minimal. Impression:               - One 1 mm polyp in the cecum, removed with a cold                            biopsy forceps. Resected and retrieved.                           - Two 4 to 5 mm polyps in the sigmoid colon and in                            the ascending colon, removed with a cold snare.                            Resected  and retrieved.                           - Non-bleeding internal hemorrhoids.                           - The examination was otherwise normal. Recommendation:           - Patient has a contact number available for                            emergencies. The signs and symptoms of potential                            delayed complications were discussed with the                            patient. Return to normal activities tomorrow.                            Written discharge instructions were provided to the  patient.                           - Resume previous diet.                           - Continue present medications.                           - Await pathology results.                           - Repeat colonoscopy in 5-10 years for surveillance                            based on pathology results. Mauri Pole, MD 10/09/2016 12:30:18 PM This report has been signed electronically.

## 2016-10-09 NOTE — Progress Notes (Signed)
Called to room to assist during endoscopic procedure.  Patient ID and intended procedure confirmed with present staff. Received instructions for my participation in the procedure from the performing physician.  

## 2016-10-09 NOTE — Patient Instructions (Signed)
YOU HAD AN ENDOSCOPIC PROCEDURE TODAY AT THE [facility]:   Refer to the procedure report that was given to you for any specific questions about what was found during the examination.  If the procedure report does not answer your questions, please call your gastroenterologist to clarify.  If you requested that your care partner not be given the details of your procedure findings, then the procedure report has been included in a sealed envelope for you to review at your convenience later.  YOU SHOULD EXPECT: Some feelings of bloating in the abdomen. Passage of more gas than usual.  Walking can help get rid of the air that was put into your GI tract during the procedure and reduce the bloating. If you had a lower endoscopy (such as a colonoscopy or flexible sigmoidoscopy) you may notice spotting of blood in your stool or on the toilet paper. If you underwent a bowel prep for your procedure, you may not have a normal bowel movement for a few days.  Please Note:  You might notice some irritation and congestion in your nose or some drainage.  This is from the oxygen used during your procedure.  There is no need for concern and it should clear up in a day or so.  SYMPTOMS TO REPORT IMMEDIATELY:   Following lower endoscopy (colonoscopy or flexible sigmoidoscopy):  Excessive amounts of blood in the stool  Significant tenderness or worsening of abdominal pains  Swelling of the abdomen that is new, acute  Fever of 100F or higher    For urgent or emergent issues, a gastroenterologist can be reached at any hour by calling [phone].   DIET:  We do recommend a small meal at first, but then you may proceed to your regular diet.  Drink plenty of fluids but you should avoid alcoholic beverages for 24 hours.  ACTIVITY:  You should plan to take it easy for the rest of today and you should NOT DRIVE or use heavy machinery until tomorrow (because of the sedation medicines used during the test).     FOLLOW UP: Our staff will call the number listed on your records the next business day following your procedure to check on you and address any questions or concerns that you may have regarding the information given to you following your procedure. If we do not reach you, we will leave a message.  However, if you are feeling well and you are not experiencing any problems, there is no need to return our call.  We will assume that you have returned to your regular daily activities without incident.  If any biopsies were taken you will be contacted by phone or by letter within the next 1-3 weeks.  Please call us at [phone] if you have not heard about the biopsies in 3 weeks.    SIGNATURES/CONFIDENTIALITY: You and/or your care partner have signed paperwork which will be entered into your electronic medical record.  These signatures attest to the fact that that the information above on your After Visit Summary has been reviewed and is understood.  Full responsibility of the confidentiality of this discharge information lies with you and/or your care-partner.   Resume medications. Information given on polyps and hemorrhoids. 

## 2016-10-09 NOTE — Progress Notes (Signed)
Pt's states no medical or surgical changes since previsit or office visit. 

## 2016-10-09 NOTE — Progress Notes (Signed)
A and O x3. Report to RN. Tolerated MAC anesthesia well.

## 2016-10-10 ENCOUNTER — Telehealth: Payer: Self-pay | Admitting: *Deleted

## 2016-10-10 ENCOUNTER — Telehealth: Payer: Self-pay

## 2016-10-10 NOTE — Telephone Encounter (Signed)
  Follow up Call-  Call back number 10/09/2016  Post procedure Call Back phone  # [phone]  Permission to leave phone message Yes  Some recent data might be hidden     Patient questions:  Message left to all [place] if necessary.  Second call.

## 2016-10-10 NOTE — Telephone Encounter (Signed)
Left message on answering machine. 

## 2016-10-21 ENCOUNTER — Encounter: Payer: Self-pay | Admitting: Gastroenterology

## 2016-11-28 ENCOUNTER — Other Ambulatory Visit: Payer: Self-pay | Admitting: Family Medicine

## 2017-04-08 ENCOUNTER — Other Ambulatory Visit: Payer: Self-pay | Admitting: Family Medicine

## 2017-04-29 ENCOUNTER — Other Ambulatory Visit: Payer: Self-pay | Admitting: Family Medicine

## 2017-07-04 ENCOUNTER — Other Ambulatory Visit: Payer: Self-pay | Admitting: Family Medicine

## 2017-10-25 ENCOUNTER — Other Ambulatory Visit: Payer: Self-pay | Admitting: Family Medicine

## 2017-11-24 ENCOUNTER — Other Ambulatory Visit: Payer: Self-pay | Admitting: Family Medicine

## 2017-12-16 ENCOUNTER — Other Ambulatory Visit: Payer: Self-pay | Admitting: Family Medicine

## 2017-12-20 ENCOUNTER — Other Ambulatory Visit: Payer: Self-pay | Admitting: Family Medicine

## 2018-01-26 ENCOUNTER — Telehealth: Payer: Self-pay | Admitting: Family Medicine

## 2018-01-26 ENCOUNTER — Other Ambulatory Visit: Payer: Self-pay | Admitting: Family Medicine

## 2018-02-03 NOTE — Telephone Encounter (Signed)
Pt called and scheduled med refill appointment.

## 2018-02-04 ENCOUNTER — Ambulatory Visit: Payer: BLUE CROSS/BLUE SHIELD | Admitting: Family Medicine

## 2018-02-04 DIAGNOSIS — I10 Essential (primary) hypertension: Secondary | ICD-10-CM

## 2018-02-11 ENCOUNTER — Ambulatory Visit (INDEPENDENT_AMBULATORY_CARE_PROVIDER_SITE_OTHER): Payer: BLUE CROSS/BLUE SHIELD | Admitting: Family Medicine

## 2018-02-11 ENCOUNTER — Encounter: Payer: Self-pay | Admitting: Family Medicine

## 2018-02-11 VITALS — BP 138/84 | HR 72 | Temp 97.8°F | Ht 62.0 in | Wt 166.4 lb

## 2018-02-11 DIAGNOSIS — N390 Urinary tract infection, site not specified: Secondary | ICD-10-CM | POA: Diagnosis not present

## 2018-02-11 DIAGNOSIS — Z0000 Encounter for general adult medical examination without abnormal findings: Secondary | ICD-10-CM

## 2018-02-11 LAB — POCT URINALYSIS DIPSTICK
Bilirubin, UA: NEGATIVE
Blood, UA: NEGATIVE
Glucose, UA: NEGATIVE
Ketones, UA: NEGATIVE
Nitrite, UA: NEGATIVE
Protein, UA: POSITIVE — AB
Spec Grav, UA: 1.025 (ref 1.010–1.025)
Urobilinogen, UA: 0.2 E.U./dL
pH, UA: 6 (ref 5.0–8.0)

## 2018-02-11 LAB — HEPATIC FUNCTION PANEL
ALT: 18 U/L (ref 0–35)
AST: 23 U/L (ref 0–37)
Albumin: 4.2 g/dL (ref 3.5–5.2)
Alkaline Phosphatase: 68 U/L (ref 39–117)
Bilirubin, Direct: 0.1 mg/dL (ref 0.0–0.3)
Total Bilirubin: 0.7 mg/dL (ref 0.2–1.2)
Total Protein: 6.8 g/dL (ref 6.0–8.3)

## 2018-02-11 LAB — LIPID PANEL
Cholesterol: 320 mg/dL — ABNORMAL HIGH (ref 0–200)
HDL: 77 mg/dL (ref >39.00)
LDL Cholesterol: 214 mg/dL — ABNORMAL HIGH (ref 0–99)
NonHDL: 243.23
Total CHOL/HDL Ratio: 4
Triglycerides: 147 mg/dL (ref 0.0–149.0)
VLDL: 29.4 mg/dL (ref 0.0–40.0)

## 2018-02-11 LAB — CBC WITH DIFFERENTIAL/PLATELET
Basophils Absolute: 0 10*3/uL (ref 0.0–0.1)
Basophils Relative: 1 % (ref 0.0–3.0)
Eosinophils Absolute: 0.1 10*3/uL (ref 0.0–0.7)
Eosinophils Relative: 2.7 % (ref 0.0–5.0)
HCT: 43.8 % (ref 36.0–46.0)
Hemoglobin: 14.7 g/dL (ref 12.0–15.0)
Lymphocytes Relative: 42.1 % (ref 12.0–46.0)
Lymphs Abs: 1.8 10*3/uL (ref 0.7–4.0)
MCHC: 33.6 g/dL (ref 30.0–36.0)
MCV: 92.6 fl (ref 78.0–100.0)
Monocytes Absolute: 0.4 10*3/uL (ref 0.1–1.0)
Monocytes Relative: 10.4 % (ref 3.0–12.0)
Neutro Abs: 1.8 10*3/uL (ref 1.4–7.7)
Neutrophils Relative %: 43.8 % (ref 43.0–77.0)
Platelets: 233 10*3/uL (ref 150.0–400.0)
RBC: 4.73 Mil/uL (ref 3.87–5.11)
RDW: 15.5 % (ref 11.5–15.5)
WBC: 4.2 10*3/uL (ref 4.0–10.5)

## 2018-02-11 LAB — BASIC METABOLIC PANEL
BUN: 15 mg/dL (ref 6–23)
CO2: 26 mEq/L (ref 19–32)
Calcium: 9.5 mg/dL (ref 8.4–10.5)
Chloride: 105 mEq/L (ref 96–112)
Creatinine, Ser: 0.85 mg/dL (ref 0.40–1.20)
GFR: 89.57 mL/min (ref >60.00)
Glucose, Bld: 95 mg/dL (ref 70–99)
Potassium: 3.9 mEq/L (ref 3.5–5.1)
Sodium: 139 mEq/L (ref 135–145)

## 2018-02-11 LAB — TSH: TSH: 1.61 u[IU]/mL (ref 0.35–4.50)

## 2018-02-11 MED ORDER — NITROFURANTOIN MONOHYD MACRO 100 MG PO CAPS: 100.0000 mg | ORAL_CAPSULE | Freq: Two times a day (BID) | ORAL | 0 refills | Status: DC

## 2018-02-11 NOTE — Progress Notes (Signed)
   Subjective:    Patient ID: KALAH PFLUM, female    DOB: [birth year], 52 y.o.   MRN: [id number]  HPI Here for a well exam and also some other issues. She has had urinary pressure and burning for a few days. No fever. She says she stopped taking Lipitor 3 months ago because it made her muscles hurt.    Review of Systems  Constitutional: Negative.   HENT: Negative.   Eyes: Negative.   Respiratory: Negative.   Cardiovascular: Negative.   Gastrointestinal: Negative.   Genitourinary: Positive for dysuria, frequency and urgency. Negative for decreased urine volume, difficulty urinating, dyspareunia, enuresis, flank pain, hematuria and pelvic pain.  Musculoskeletal: Negative.   Skin: Negative.   Neurological: Negative.   Psychiatric/Behavioral: Negative.        Objective:   Physical Exam  Constitutional: She is oriented to person, place, and time. She appears well-developed and well-nourished. No distress.  HENT:  Head: Normocephalic and atraumatic.  Right Ear: External ear normal.  Left Ear: External ear normal.  Nose: Nose normal.  Mouth/Throat: Oropharynx is clear and moist. No oropharyngeal exudate.  Eyes: Pupils are equal, round, and reactive to light. Conjunctivae and EOM are normal. No scleral icterus.  Neck: Normal range of motion. Neck supple. No JVD present. No thyromegaly present.  Cardiovascular: Normal rate, regular rhythm, normal heart sounds and intact distal pulses. Exam reveals no gallop and no friction rub.  No murmur heard. Pulmonary/Chest: Effort normal and breath sounds normal. No respiratory distress. She has no wheezes. She has no rales. She exhibits no tenderness.  Abdominal: Soft. Bowel sounds are normal. She exhibits no distension and no mass. There is no tenderness. There is no rebound and no guarding.  Musculoskeletal: Normal range of motion. She exhibits no edema or tenderness.  Lymphadenopathy:    She has no cervical adenopathy.  Neurological: She  is alert and oriented to person, place, and time. She has normal reflexes. She displays normal reflexes. No cranial nerve deficit. She exhibits normal muscle tone. Coordination normal.  Skin: Skin is warm and dry. No rash noted. No erythema.  Psychiatric: She has a normal mood and affect. Her behavior is normal. Judgment and thought content normal.          Assessment & Plan:  Well exam. We discussed diet and exercise. Get fasting labs. Treat the UTI with Macrobid and culture the sample.  Alysia Penna, MD

## 2018-02-12 LAB — URINE CULTURE
MICRO NUMBER:: 91484003
SPECIMEN QUALITY:: ADEQUATE

## 2018-02-16 ENCOUNTER — Other Ambulatory Visit: Payer: Self-pay | Admitting: Family Medicine

## 2018-02-16 MED ORDER — ROSUVASTATIN CALCIUM 10 MG PO TABS: 10.0000 mg | ORAL_TABLET | Freq: Every day | ORAL | 3 refills | Status: DC

## 2018-02-18 ENCOUNTER — Ambulatory Visit: Payer: Self-pay | Admitting: *Deleted

## 2018-02-18 NOTE — Telephone Encounter (Signed)
Patient was treated for UTI on 02/11/18 with Dr. Sarajane Jews. She completed Macrobid today. She reports the burning with voiding improved for a few days but over the last two days started burning again. She also is experiencing vaginal itching and when she wipes she notes a whitish pinkish-tinged substance she thinks is yeast. No fever/pain/frequency/hesitency.advice care reviewed including increasing water intake and Azo otc for relief. She lives in [place] and is not able to drop off a urine sample nor come in. Routing to PCP for advice. [facility] and on [facility]. Answer Assessment - Initial Assessment Questions 1. SYMPTOM: "What's the main symptom you're concerned about?" (e.g., pain, itching, dryness)    Burning only with voiding 2. LOCATION: "Where is the burning located?" (e.g., inside/outside, left/right)     With urine 3. ONSET: "When did the  burning  start?"     2 days ago 4. PAIN: "Is there any pain?" If so, ask: "How bad is it?" (Scale: 1-10; mild, moderate, severe)     mild 5. ITCHING: "Is there any itching?" If so, ask: "How bad is it?" (Scale: 1-10; mild, moderate, severe)    itches 6. CAUSE: "What do you think is causing the discharge?" "Have you had the same problem before? What happened then?"     Yes just completed Macrobid 7. OTHER SYMPTOMS: "Do you have any other symptoms?" (e.g., fever, itching, vaginal bleeding, pain with urination, injury to genital area, vaginal foreign body)     no 8. PREGNANCY: "Is there any chance you are pregnant?" "When was your last menstrual period?"     na  Protocols used: VAGINAL [facility]

## 2018-02-18 NOTE — Telephone Encounter (Signed)
Message from Jodie Echevaria sent at 02/18/2018 9:20 AM EST   Patient called to say that she is still having symptoms of UTI vaginal burning also having a yeast infection from the Antibiotics. Say yeast comes in clots when she wipes and has a pink tinge seem like its got some blood mixed in. She stated that she finished all ATBs from 02/11/18 visit but would like more plus something for the yeast. Ph# [phone]

## 2018-02-18 NOTE — Telephone Encounter (Signed)
Please advise 

## 2018-02-19 MED ORDER — FLUCONAZOLE 150 MG PO TABS: 150.0000 mg | ORAL_TABLET | Freq: Once | ORAL | 5 refills | Status: AC

## 2018-02-19 NOTE — Addendum Note (Signed)
Addended by: Elie Confer on: 02/19/2018 09:46 AM   Modules accepted: Orders

## 2018-02-19 NOTE — Telephone Encounter (Signed)
Diflucan sent to the pts pharmacy and pt is aware.

## 2018-02-19 NOTE — Telephone Encounter (Signed)
Call in Diflucan 150 mg #1 with 5 rf 

## 2018-03-02 ENCOUNTER — Other Ambulatory Visit: Payer: Self-pay | Admitting: Family Medicine

## 2018-06-05 DIAGNOSIS — N3020 Other chronic cystitis without hematuria: Secondary | ICD-10-CM | POA: Diagnosis not present

## 2018-06-05 DIAGNOSIS — N3946 Mixed incontinence: Secondary | ICD-10-CM | POA: Diagnosis not present

## 2018-11-25 ENCOUNTER — Other Ambulatory Visit: Payer: Self-pay | Admitting: Family Medicine

## 2018-11-25 NOTE — Telephone Encounter (Signed)
Patient need to schedule an ov for more refills. 

## 2018-12-15 ENCOUNTER — Other Ambulatory Visit: Payer: Self-pay | Admitting: Family Medicine

## 2019-03-29 ENCOUNTER — Other Ambulatory Visit: Payer: Self-pay | Admitting: Family Medicine

## 2019-03-29 NOTE — Telephone Encounter (Signed)
[facility] ([place]   [id number] ). Pt needs all her medication refilled at this location. (Amlodipine-valsartan, Fluoxetine, Atorvastatin) If needed pt can be contacted at [phone]

## 2019-03-30 NOTE — Telephone Encounter (Signed)
Patients last OV was 02/11/2018. She will need an OV for further refills.

## 2019-03-30 NOTE — Telephone Encounter (Signed)
Spoke with the patient. She stated that she does not have insurance at this time and is unable to have an OV. She is using [facility] to get her prescriptions.   Please advise. Kristine [name] for refills?

## 2019-03-31 NOTE — Telephone Encounter (Signed)
Yes please send all these refills to the new pharmacy

## 2019-04-01 MED ORDER — AMLODIPINE BESYLATE-VALSARTAN 10-160 MG PO TABS: 1.0000 | ORAL_TABLET | Freq: Every day | ORAL | 11 refills | Status: DC

## 2019-04-01 NOTE — Telephone Encounter (Signed)
Done

## 2019-04-01 NOTE — Addendum Note (Signed)
Addended by: Alysia Penna A on: 04/01/2019 09:26 AM   Modules accepted: Orders

## 2019-04-02 ENCOUNTER — Telehealth: Payer: Self-pay | Admitting: Family Medicine

## 2019-04-02 MED ORDER — AMLODIPINE BESYLATE-VALSARTAN 10-160 MG PO TABS: 1.0000 | ORAL_TABLET | Freq: Every day | ORAL | 11 refills | Status: DC

## 2019-04-02 NOTE — Telephone Encounter (Signed)
Pt stated that Rx amlodipine-valsartan that [facility] need to go to [facility] [address] she stated that she is out of it.

## 2019-04-02 NOTE — Telephone Encounter (Signed)
Rx has been resent to the correct pharmacy. Patient is aware.

## 2019-07-10 ENCOUNTER — Encounter (HOSPITAL_COMMUNITY): Payer: Self-pay | Admitting: Emergency Medicine

## 2019-07-10 ENCOUNTER — Emergency Department (HOSPITAL_COMMUNITY): Payer: BLUE CROSS/BLUE SHIELD

## 2019-07-10 ENCOUNTER — Other Ambulatory Visit: Payer: Self-pay

## 2019-07-10 ENCOUNTER — Emergency Department (HOSPITAL_COMMUNITY)
Admission: EM | Admit: 2019-07-10 | Discharge: 2019-07-10 | Disposition: A | Discharge status: Home / Self Care | Payer: BLUE CROSS/BLUE SHIELD | Attending: Emergency Medicine | Admitting: Emergency Medicine

## 2019-07-10 DIAGNOSIS — F17210 Nicotine dependence, cigarettes, uncomplicated: Secondary | ICD-10-CM | POA: Insufficient documentation

## 2019-07-10 DIAGNOSIS — M65341 Trigger finger, right ring finger: Secondary | ICD-10-CM | POA: Diagnosis not present

## 2019-07-10 DIAGNOSIS — M779 Enthesopathy, unspecified: Secondary | ICD-10-CM | POA: Diagnosis not present

## 2019-07-10 DIAGNOSIS — M79644 Pain in right finger(s): Secondary | ICD-10-CM | POA: Diagnosis not present

## 2019-07-10 DIAGNOSIS — M778 Other enthesopathies, not elsewhere classified: Secondary | ICD-10-CM

## 2019-07-10 DIAGNOSIS — Z79899 Other long term (current) drug therapy: Secondary | ICD-10-CM | POA: Insufficient documentation

## 2019-07-10 DIAGNOSIS — M7989 Other specified soft tissue disorders: Secondary | ICD-10-CM | POA: Diagnosis not present

## 2019-07-10 DIAGNOSIS — M65311 Trigger thumb, right thumb: Secondary | ICD-10-CM | POA: Insufficient documentation

## 2019-07-10 DIAGNOSIS — I10 Essential (primary) hypertension: Secondary | ICD-10-CM | POA: Insufficient documentation

## 2019-07-10 DIAGNOSIS — R202 Paresthesia of skin: Secondary | ICD-10-CM | POA: Insufficient documentation

## 2019-07-10 MED ORDER — TRAMADOL HCL 50 MG PO TABS: 50.0000 mg | ORAL_TABLET | Freq: Four times a day (QID) | ORAL | 0 refills | Status: DC | PRN

## 2019-07-10 MED ORDER — MELOXICAM 15 MG PO TABS: 15.0000 mg | ORAL_TABLET | Freq: Every day | ORAL | 0 refills | Status: DC

## 2019-07-10 NOTE — ED Provider Notes (Signed)
[facility] EMERGENCY DEPARTMENT Provider Note   CSN: [id number] Arrival date & time: 07/10/19  1150     History Chief Complaint  Patient presents with  . thumb pain    Kristine [name] is a 54 y.o. RHD female who presents with R thumb pain. It started about 2.5 months ago over the base of the R thumb. Nothing makes it better. Movement and palpation make it worse. Sometimes she has some swelling of the thumb and also feels like it gets stuck and "pops" when she moves it. She also has intermittent tingling which is mild. She has tried Ibuprofen and topical rubs without relief. She denies wrist pain or pain in other fingers. 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 She denies any specific injury.  HPI     Past Medical History:  Diagnosis Date  . Depression   . Endometriosis   . GERD (gastroesophageal reflux disease)   . Hyperlipidemia   . Hypertension     Patient Active Problem List   Diagnosis Date Noted  . Carpal tunnel syndrome 10/20/2012  . Nausea & vomiting 06/06/2011  . Allergic rhinitis, cause unspecified 06/06/2011  . Acute sinusitis with symptoms greater than 10 days 06/06/2011  . Tobacco user 06/06/2011  . OVERACTIVE BLADDER 04/03/2010  . LOW BACK PAIN, ACUTE 02/08/2010  . BACK PAIN 02/08/2010  . URINARY FREQUENCY, HX OF 02/08/2010  . CIGARETTE SMOKER 07/25/2009  . NAUSEA AND VOMITING 07/25/2009  . ABDOMINAL PAIN, EPIGASTRIC 07/14/2009  . VIRAL INFECTION 03/07/2008  . SYNCOPE 03/01/2008  . GASTROENTERITIS, ACUTE 01/04/2008  . ACUTE BRONCHITIS 12/23/2007  . SACROILIITIS 12/23/2007  . HYPERLIPIDEMIA 07/15/2007  . DEPRESSION 07/15/2007  . Essential hypertension 07/15/2007  . GERD 07/15/2007  . MENOPAUSAL SYNDROME 07/15/2007    Past Surgical History:  Procedure Laterality Date  . ABDOMINAL HYSTERECTOMY  2005  . BREAST BIOPSY     benign  . CESAREAN SECTION    . COLONOSCOPY  10/09/2016   per Dr.  Silverio Decamp, adenomatous polyps, repeat in 5 yrs   . ESOPHAGOGASTRODUODENOSCOPY       OB History   No obstetric history on file.     Family History  Problem Relation Age of Onset  . Arthritis Other   . Breast cancer Other   . Diabetes Other   . Hyperlipidemia Other   . Hypertension Other   . Colon cancer Neg Hx   . Esophageal cancer Neg Hx   . Rectal cancer Neg Hx   . Stomach cancer Neg Hx     Social History   Tobacco Use  . Smoking status: Current Every Day Smoker    Packs/day: 0.50    Types: Cigarettes  . Smokeless tobacco: Never Used  Substance Use Topics  . Alcohol use: Yes    Alcohol/week: 1.0 standard drinks    Types: 1 Standard drinks or equivalent per week    Comment: occ  . Drug use: No    Home Medications Prior to Admission medications   Medication Sig Start Date End Date Taking? Authorizing Provider  amLODipine-valsartan (EXFORGE) 10-160 MG tablet Take 1 tablet by mouth daily. 04/02/19   Laurey Morale, MD  atorvastatin (LIPITOR) 20 MG tablet TAKE 1 TABLET EVERY DAY Patient not taking: Reported on 02/11/2018 07/04/17   Laurey Morale, MD  Fesoterodine Fumarate (TOVIAZ PO) Take by mouth.    [provider]  FLUoxetine (PROZAC) 40 MG capsule TAKE 2 CAPSULES  BY MOUTH EVERY DAY 12/16/18   Laurey Morale, MD  nitrofurantoin, macrocrystal-monohydrate, (MACROBID) 100 MG capsule Take 1 capsule (100 mg total) by mouth 2 (two) times daily. 02/11/18   Laurey Morale, MD  rosuvastatin (CRESTOR) 10 MG tablet TAKE 1 TABLET BY MOUTH EVERY DAY 12/16/18   Laurey Morale, MD  trimethoprim (TRIMPEX) 100 MG tablet TAKE 1 TABLET BY MOUTH EVERYDAY AT BEDTIME 08/01/16   [provider]    Allergies    Ciprofloxacin, Codeine, and Lipitor [atorvastatin calcium]  Review of Systems   Review of Systems  Musculoskeletal: Positive for arthralgias and joint swelling.  Skin: Negative for wound.  Neurological: Negative for weakness and numbness.    Physical  Exam Updated Vital Signs BP (!) 132/93 (BP Location: Right Arm)   Pulse 82   Temp 99 F (37.2 C) (Oral)   Resp 18   Ht 5\' 3"  (1.6 m)   Wt 72.6 kg   SpO2 99%   BMI 28.34 kg/m   Physical Exam Vitals and nursing note reviewed.  Constitutional:      General: She is not in acute distress.    Appearance: Normal appearance. She is well-developed. She is not ill-appearing.  HENT:     Head: Normocephalic and atraumatic.  Eyes:     General: No scleral icterus.       Right eye: No discharge.        Left eye: No discharge.     Conjunctiva/sclera: Conjunctivae normal.     Pupils: Pupils are equal, round, and reactive to light.  Cardiovascular:     Rate and Rhythm: Normal rate.  Pulmonary:     Effort: Pulmonary effort is normal. No respiratory distress.  Abdominal:     General: There is no distension.  Musculoskeletal:     Cervical back: Normal range of motion.     Comments: Right thumb: Mild swelling around the IP joint. Tenderness along the extensor tendon but more on the flexor tendon. No wrist tenderness or pain with movement. 2+ radial pulse.  Skin:    General: Skin is warm and dry.  Neurological:     Mental Status: She is alert and oriented to person, place, and time.  Psychiatric:        Behavior: Behavior normal.     ED Results / Procedures / Treatments   Labs (all labs ordered are listed, but only abnormal results are displayed) Labs Reviewed - No data to display  EKG None  Radiology DG Finger Thumb Right  Result Date: 07/10/2019 CLINICAL DATA:  Right thumb pain and swelling for 2 months. EXAM: RIGHT THUMB 2+V COMPARISON:  None. FINDINGS: There is no evidence of fracture or dislocation. There is no evidence of arthropathy or other focal bone abnormality. Soft tissues are unremarkable. IMPRESSION: Negative. Electronically Signed   By: Fidela Salisbury M.D.   On: 07/10/2019 13:53    Procedures Procedures (including critical care time)  Medications Ordered in  ED Medications - No data to display  ED Course  I have reviewed the triage vital signs and the nursing notes.  Pertinent labs & imaging results that were available during my care of the patient were reviewed by me and considered in my medical decision making (see chart for details).  54 year old female presents with right thumb pain which is chronic in nature.  Pain is primarily over the base of the thumb and somewhat over the IP joint as well.  There is mild swelling appreciated on exam.  She is tender over both the extensor and flexor tendon. She describes some popping and catching sensation of the thumb so she may have some element of trigger finger with associated tendonitis. Since pain is chronic will try to maximize conservative management with a brace and Mobic but advised she may need to see ortho for possible injections or physical therapy. She verbalized understanding.  MDM Rules/Calculators/A&P                       Final Clinical Impression(s) / ED Diagnoses Final diagnoses:  Thumb tendonitis  Trigger finger of right thumb    Rx / DC Orders ED Discharge Orders    None       Recardo Evangelist, PA-C 07/11/19 0848    Malvin Johns, MD 07/11/19 0900

## 2019-07-10 NOTE — ED Triage Notes (Signed)
C/o R thumb pain x 2 months.  No known injury.    Taking ibuprofen with some relief.

## 2019-07-10 NOTE — Discharge Instructions (Signed)
Start Meloxicam once daily. Take with food. Do not take with Ibuprofen or Aleve Take Tramadol as needed for severe pain. This medicine can make you sleepy You can apply topical medicine such as icy hot or aspercreme to help with pain Wear brace to provide support and help with pain You can ice the thumb several times a day for pain and swelling Please follow up with Dr. Amedeo Plenty if your symptoms are not improving

## 2019-09-17 ENCOUNTER — Ambulatory Visit (INDEPENDENT_AMBULATORY_CARE_PROVIDER_SITE_OTHER): Payer: BC Managed Care – PPO | Admitting: Family Medicine

## 2019-09-17 ENCOUNTER — Other Ambulatory Visit: Payer: Self-pay

## 2019-09-17 ENCOUNTER — Encounter: Payer: Self-pay | Admitting: Family Medicine

## 2019-09-17 VITALS — Temp 99.1°F | Wt 168.4 lb

## 2019-09-17 DIAGNOSIS — B029 Zoster without complications: Secondary | ICD-10-CM

## 2019-09-17 MED ORDER — METHYLPREDNISOLONE 4 MG PO TBPK: ORAL_TABLET | ORAL | 0 refills | Status: DC

## 2019-09-17 MED ORDER — VALACYCLOVIR HCL 1 G PO TABS: 1000.0000 mg | ORAL_TABLET | Freq: Three times a day (TID) | ORAL | 0 refills | Status: DC

## 2019-09-17 NOTE — Progress Notes (Signed)
   Subjective:    Patient ID: Kristine [name], female    DOB: [birth year], 54 y.o.   MRN: [id number]  HPI Here for 2 days of a painful rash on the left back of the head. She feels fine otherwise, no fevers. This started at the hairline and is now spreading up to the left parietal scalp.    Review of Systems  Constitutional: Negative.   Respiratory: Negative.   Cardiovascular: Negative.   Skin: Positive for rash.       Objective:   Physical Exam Constitutional:      Appearance: Normal appearance.  Cardiovascular:     Rate and Rhythm: Normal rate and regular rhythm.     Pulses: Normal pulses.     Heart sounds: Normal heart sounds.  Pulmonary:     Effort: Pulmonary effort is normal.     Breath sounds: Normal breath sounds.  Skin:    Comments: Scattered papulovesicular lesions on the left parietal and left occipital scalp. These are very tender. Several tender posterior auricular nodes  Neurological:     Mental Status: She is alert.           Assessment & Plan:  Shingles, treat with Valtrex and a Medrol dose pack. Recheck at her well exam next week.  Alysia Penna, MD

## 2019-09-22 DIAGNOSIS — N3946 Mixed incontinence: Secondary | ICD-10-CM | POA: Diagnosis not present

## 2019-09-22 DIAGNOSIS — R8271 Bacteriuria: Secondary | ICD-10-CM | POA: Diagnosis not present

## 2019-09-22 DIAGNOSIS — N3020 Other chronic cystitis without hematuria: Secondary | ICD-10-CM | POA: Diagnosis not present

## 2019-09-23 ENCOUNTER — Ambulatory Visit (INDEPENDENT_AMBULATORY_CARE_PROVIDER_SITE_OTHER): Payer: BC Managed Care – PPO | Admitting: Family Medicine

## 2019-09-23 ENCOUNTER — Encounter: Payer: Self-pay | Admitting: Family Medicine

## 2019-09-23 ENCOUNTER — Other Ambulatory Visit: Payer: Self-pay

## 2019-09-23 VITALS — BP 122/80 | HR 76 | Temp 98.5°F | Ht 63.75 in | Wt 169.0 lb

## 2019-09-23 DIAGNOSIS — Z0000 Encounter for general adult medical examination without abnormal findings: Secondary | ICD-10-CM

## 2019-09-23 MED ORDER — TRAMADOL HCL 50 MG PO TABS: 100.0000 mg | ORAL_TABLET | Freq: Four times a day (QID) | ORAL | 0 refills | Status: DC | PRN

## 2019-09-23 MED ORDER — ATORVASTATIN CALCIUM 20 MG PO TABS: 20.0000 mg | ORAL_TABLET | Freq: Every day | ORAL | 3 refills | Status: DC

## 2019-09-23 MED ORDER — NITROFURANTOIN MONOHYD MACRO 100 MG PO CAPS
100.0000 mg | ORAL_CAPSULE | Freq: Every day | ORAL | 0 refills | Status: DC
Start: 2019-09-23 — End: 2021-04-19

## 2019-09-23 NOTE — Progress Notes (Signed)
   Subjective:    Patient ID: Kristine [name], female    DOB: 01/05/1966, 54 y.o.   MRN: [id number]  HPI Here for a well exam. She feels well except for the shingles on the posterior scalp. We saw her last week for this and gave her a Medrol dose pack and Valtrex. She is feeling better but this has caused her to have headaches that keep her awake at night.    Review of Systems  Constitutional: Negative.   HENT: Negative.   Eyes: Negative.   Respiratory: Negative.   Cardiovascular: Negative.   Gastrointestinal: Negative.   Genitourinary: Negative for decreased urine volume, difficulty urinating, dyspareunia, dysuria, enuresis, flank pain, frequency, hematuria, pelvic pain and urgency.  Musculoskeletal: Negative.   Skin: Positive for rash.  Neurological: Negative.   Psychiatric/Behavioral: Negative.        Objective:   Physical Exam Constitutional:      General: She is not in acute distress.    Appearance: She is well-developed.  HENT:     Head: Normocephalic and atraumatic.     Right Ear: External ear normal.     Left Ear: External ear normal.     Nose: Nose normal.     Mouth/Throat:     Pharynx: No oropharyngeal exudate.  Eyes:     General: No scleral icterus.    Conjunctiva/sclera: Conjunctivae normal.     Pupils: Pupils are equal, round, and reactive to light.  Neck:     Thyroid: No thyromegaly.     Vascular: No JVD.  Cardiovascular:     Rate and Rhythm: Normal rate and regular rhythm.     Heart sounds: Normal heart sounds. No murmur heard.  No friction rub. No gallop.   Pulmonary:     Effort: Pulmonary effort is normal. No respiratory distress.     Breath sounds: Normal breath sounds. No wheezing or rales.  Chest:     Chest wall: No tenderness.  Abdominal:     General: Bowel sounds are normal. There is no distension.     Palpations: Abdomen is soft. There is no mass.     Tenderness: There is no abdominal tenderness. There is no guarding or rebound.    Musculoskeletal:        General: No tenderness. Normal range of motion.     Cervical back: Normal range of motion and neck supple.  Lymphadenopathy:     Cervical: No cervical adenopathy.  Skin:    General: Skin is warm and dry.     Findings: No erythema or rash.  Neurological:     Mental Status: She is alert and oriented to person, place, and time.     Cranial Nerves: No cranial nerve deficit.     Motor: No abnormal muscle tone.     Coordination: Coordination normal.     Deep Tendon Reflexes: Reflexes are normal and symmetric. Reflexes normal.  Psychiatric:        Behavior: Behavior normal.        Thought Content: Thought content normal.        Judgment: Judgment normal.           Assessment & Plan:  Well exam. We discussed diet and exercise. Get fasting labs. She can try Tramadol to help with pain while she recovers from the shingles. Alysia Penna, MD

## 2019-09-24 LAB — CBC WITH DIFFERENTIAL/PLATELET
Absolute Monocytes: 640 cells/uL (ref 200–950)
Basophils Absolute: 40 cells/uL (ref 0–200)
Basophils Relative: 0.6 %
Eosinophils Absolute: 139 cells/uL (ref 15–500)
Eosinophils Relative: 2.1 %
HCT: 44.7 % (ref 35.0–45.0)
Hemoglobin: 14.4 g/dL (ref 11.7–15.5)
Lymphs Abs: 3373 cells/uL (ref 850–3900)
MCH: 30.6 pg (ref 27.0–33.0)
MCHC: 32.2 g/dL (ref 32.0–36.0)
MCV: 94.9 fL (ref 80.0–100.0)
MPV: 11 fL (ref 7.5–12.5)
Monocytes Relative: 9.7 %
Neutro Abs: 2409 cells/uL (ref 1500–7800)
Neutrophils Relative %: 36.5 %
Platelets: 243 10*3/uL (ref 140–400)
RBC: 4.71 10*6/uL (ref 3.80–5.10)
RDW: 14.9 % (ref 11.0–15.0)
Total Lymphocyte: 51.1 %
WBC: 6.6 10*3/uL (ref 3.8–10.8)

## 2019-09-24 LAB — LIPID PANEL
Cholesterol: 226 mg/dL — ABNORMAL HIGH (ref <200)
HDL: 81 mg/dL (ref >=50)
LDL Cholesterol (Calc): 126 mg/dL (calc) — ABNORMAL HIGH
Non-HDL Cholesterol (Calc): 145 mg/dL (calc) — ABNORMAL HIGH (ref <130)
Total CHOL/HDL Ratio: 2.8 (calc) (ref <5.0)
Triglycerides: 87 mg/dL (ref <150)

## 2019-09-24 LAB — HEPATIC FUNCTION PANEL
AG Ratio: 1.8 (calc) (ref 1.0–2.5)
ALT: 22 U/L (ref 6–29)
AST: 18 U/L (ref 10–35)
Albumin: 4.2 g/dL (ref 3.6–5.1)
Alkaline phosphatase (APISO): 61 U/L (ref 37–153)
Bilirubin, Direct: 0.1 mg/dL (ref 0.0–0.2)
Globulin: 2.4 g/dL (calc) (ref 1.9–3.7)
Indirect Bilirubin: 0.4 mg/dL (calc) (ref 0.2–1.2)
Total Bilirubin: 0.5 mg/dL (ref 0.2–1.2)
Total Protein: 6.6 g/dL (ref 6.1–8.1)

## 2019-09-24 LAB — BASIC METABOLIC PANEL
BUN: 25 mg/dL (ref 7–25)
CO2: 31 mmol/L (ref 20–32)
Calcium: 9.4 mg/dL (ref 8.6–10.4)
Chloride: 103 mmol/L (ref 98–110)
Creat: 0.93 mg/dL (ref 0.50–1.05)
Glucose, Bld: 77 mg/dL (ref 65–99)
Potassium: 3.7 mmol/L (ref 3.5–5.3)
Sodium: 143 mmol/L (ref 135–146)

## 2019-09-24 LAB — TSH: TSH: 2.73 mIU/L

## 2019-10-12 ENCOUNTER — Other Ambulatory Visit: Payer: Self-pay | Admitting: Family Medicine

## 2019-10-12 NOTE — Telephone Encounter (Signed)
Last filled 09/23/2019 Last OV 09/17/2019  Ok to fill?

## 2019-12-14 ENCOUNTER — Other Ambulatory Visit: Payer: Self-pay

## 2019-12-14 DIAGNOSIS — Z111 Encounter for screening for respiratory tuberculosis: Secondary | ICD-10-CM

## 2019-12-14 NOTE — Progress Notes (Signed)
Patient presented for TB skin test for work request. Patient tolerated skin test well in the right forearm and was observed with no concerns.

## 2019-12-16 ENCOUNTER — Other Ambulatory Visit: Payer: Self-pay

## 2019-12-16 LAB — TB SKIN TEST
Induration: 0 mm
TB Skin Test: NEGATIVE

## 2019-12-16 NOTE — Progress Notes (Signed)
Patient presented for TB reading with no concerns.

## 2019-12-28 ENCOUNTER — Ambulatory Visit: Payer: Self-pay

## 2020-01-03 ENCOUNTER — Other Ambulatory Visit: Payer: Self-pay

## 2020-01-03 ENCOUNTER — Ambulatory Visit: Payer: Managed Care, Other (non HMO) | Admitting: *Deleted

## 2020-01-03 DIAGNOSIS — Z23 Encounter for immunization: Secondary | ICD-10-CM | POA: Diagnosis not present

## 2020-01-03 NOTE — Progress Notes (Signed)
Patient tolerated injection well today. Patient had no concerns with previous vaccines, no allergens.

## 2020-01-12 ENCOUNTER — Other Ambulatory Visit: Payer: Self-pay

## 2020-01-12 ENCOUNTER — Telehealth: Payer: Self-pay | Admitting: Family Medicine

## 2020-01-12 MED ORDER — FLUOXETINE HCL 40 MG PO CAPS: 80.0000 mg | ORAL_CAPSULE | Freq: Every day | ORAL | 3 refills | Status: DC

## 2020-01-12 NOTE — Telephone Encounter (Signed)
[facility]

## 2020-01-12 NOTE — Telephone Encounter (Signed)
Patient is calling and requesting a refill for FLUoxetine (PROZAC) 40 MG capsule sent [id number] [place], [place] - [address] AT [place]  Phone:  [phone] Fax:  [phone] CB is [phone]

## 2020-01-17 ENCOUNTER — Telehealth: Payer: Self-pay | Admitting: Family Medicine

## 2020-01-17 NOTE — Telephone Encounter (Signed)
Patient states that she is on a daily dose of antibiotics from Urologist.  She is experiencing itching, discharge, and oder x 2 days.   Please advise .  Thanks.  Dm/cma

## 2020-01-17 NOTE — Telephone Encounter (Signed)
Patient is calling and stated that she was talking an antibiotic for her uti and now she has a yeast infection. Pt wanted to see if Dr. Sarajane Jews can send [facility] a [id number] [place], [place] - [address]  [address], [place] [address]  Phone:  [phone] Fax:  [phone]

## 2020-01-18 MED ORDER — FLUCONAZOLE 150 MG PO TABS
150.0000 mg | ORAL_TABLET | Freq: Every day | ORAL | 2 refills | Status: DC
Start: 2020-01-18 — End: 2022-03-15

## 2020-01-18 NOTE — Telephone Encounter (Signed)
RX sent to the pharmacy per provider instructions.  Patient notified VIA phone no questions. Dm/cma

## 2020-01-18 NOTE — Telephone Encounter (Signed)
Call in Diflucan 150 mg #1 with 2 rf

## 2020-02-02 ENCOUNTER — Ambulatory Visit: Payer: Self-pay

## 2020-03-07 ENCOUNTER — Ambulatory Visit: Payer: Managed Care, Other (non HMO) | Attending: Internal Medicine

## 2020-03-07 DIAGNOSIS — Z23 Encounter for immunization: Secondary | ICD-10-CM

## 2020-03-07 NOTE — Progress Notes (Signed)
   Covid-19 Vaccination Clinic  Name:  JACQUES WILLINGHAM    MRN: [id number] DOB: 1966/02/12  03/07/2020  Ms. Loveland was observed post Covid-19 immunization for 15 minutes without incident. She was provided with Vaccine Information Sheet and instruction to access the V-Safe system.   Ms. Suell was instructed to call 911 with any severe reactions post vaccine: Marland Kitchen Difficulty breathing  . Swelling of face and throat  . A fast heartbeat  . A bad rash all over body  . Dizziness and weakness   Immunizations Administered    Name Date Dose VIS Date Route   Moderna Covid-19 Booster Vaccine 03/07/2020  3:31 PM 0.25 mL 12/22/2019 Intramuscular   Manufacturer: Gala Murdoch   Lot: [id number]   NDC: 26948-546-27

## 2020-04-04 ENCOUNTER — Other Ambulatory Visit: Payer: Self-pay | Admitting: Family Medicine

## 2020-04-07 ENCOUNTER — Other Ambulatory Visit: Payer: Managed Care, Other (non HMO)

## 2020-04-07 DIAGNOSIS — Z20822 Contact with and (suspected) exposure to covid-19: Secondary | ICD-10-CM

## 2020-04-08 LAB — NOVEL CORONAVIRUS, NAA: SARS-CoV-2, NAA: NOT DETECTED

## 2020-04-08 LAB — SARS-COV-2, NAA 2 DAY TAT

## 2020-07-05 ENCOUNTER — Other Ambulatory Visit: Payer: Self-pay | Admitting: Family Medicine

## 2020-10-03 ENCOUNTER — Other Ambulatory Visit: Payer: Self-pay | Admitting: Family Medicine

## 2020-10-09 ENCOUNTER — Other Ambulatory Visit: Payer: Self-pay | Admitting: Family Medicine

## 2020-10-09 NOTE — Telephone Encounter (Signed)
Pt needs appointment for further refills 

## 2021-01-12 ENCOUNTER — Other Ambulatory Visit: Payer: Self-pay | Admitting: Family Medicine

## 2021-01-12 NOTE — Telephone Encounter (Signed)
Pt needs appointment for further refills 

## 2021-01-30 ENCOUNTER — Other Ambulatory Visit: Payer: Self-pay | Admitting: Family Medicine

## 2021-01-30 ENCOUNTER — Telehealth: Payer: Self-pay

## 2021-01-30 NOTE — Telephone Encounter (Signed)
Pt is scheduled to se Dr Sarajane Jews on 02/05/2021

## 2021-01-30 NOTE — Telephone Encounter (Signed)
Patient called to schedule appt 12/5 and asked for med refill she didn't have meds available to tell me which meds she needs

## 2021-02-05 ENCOUNTER — Telehealth: Payer: Managed Care, Other (non HMO) | Admitting: Family Medicine

## 2021-02-05 ENCOUNTER — Encounter: Payer: Self-pay | Admitting: Family Medicine

## 2021-02-05 DIAGNOSIS — R0989 Other specified symptoms and signs involving the circulatory and respiratory systems: Secondary | ICD-10-CM

## 2021-02-05 DIAGNOSIS — U071 COVID-19: Secondary | ICD-10-CM

## 2021-02-05 LAB — POCT INFLUENZA A/B
Influenza A, POC: NEGATIVE
Influenza B, POC: NEGATIVE

## 2021-02-05 LAB — POC COVID19 BINAXNOW: SARS Coronavirus 2 Ag: POSITIVE — AB

## 2021-02-05 MED ORDER — MOLNUPIRAVIR EUA 200MG CAPSULE: 4.0000 | ORAL_CAPSULE | Freq: Two times a day (BID) | ORAL | 0 refills | Status: AC

## 2021-02-05 NOTE — Progress Notes (Signed)
   Subjective:    Patient ID: Kristine [name], female    DOB: [birth year], 55 y.o.   MRN: [id number]  HPI Virtual Visit via Telephone Note  I connected with the patient on 02/05/21 at  1:45 PM EST by telephone and verified that I am speaking with the correct person using two identifiers.   I discussed the limitations, risks, security and privacy concerns of performing an evaluation and management service by telephone and the availability of in person appointments. I also discussed with the patient that there may be a patient responsible charge related to this service. The patient expressed understanding and agreed to proceed.  Location patient: home Location provider: work or home office Participants present for the call: patient, provider Patient did not have a visit in the prior 7 days to address this/these issue(s).   History of Present Illness: Here for 3 days of headaches, a dry cough, and nausea without vomiting. She had a fever and a ST the first day and these went away. Drinking fluids and taking Theraflu. She has tested positive for the Covid-19 virus here today.    Observations/Objective: Patient sounds cheerful and well on the phone. I do not appreciate any SOB. Speech and thought processing are grossly intact. Patient reported vitals:  Assessment and Plan: Covid 19 infection. Treat with Molnupiravir for 5 days.  Alysia Penna, MD   Follow Up Instructions:     [phone] [phone] 11-20 [phone] 21-30 I did not refer this patient for an OV in the next 24 hours for this/these issue(s).  I discussed the assessment and treatment plan with the patient. The patient was provided an opportunity to ask questions and all were answered. The patient agreed with the plan and demonstrated an understanding of the instructions.   The patient was advised to call back or seek an in-person evaluation if the symptoms worsen or if the condition fails to improve as anticipated.  I provided  15 minutes of non-face-to-face time during this encounter.   Alysia Penna, MD     Review of Systems     Objective:   Physical Exam        Assessment & Plan:

## 2021-02-05 NOTE — Addendum Note (Signed)
Addended by: Wyvonne Lenz on: 02/05/2021 04:44 PM   Modules accepted: Orders

## 2021-02-07 ENCOUNTER — Other Ambulatory Visit: Payer: Self-pay | Admitting: Family Medicine

## 2021-02-24 ENCOUNTER — Other Ambulatory Visit: Payer: Self-pay | Admitting: Family Medicine

## 2021-03-01 ENCOUNTER — Other Ambulatory Visit: Payer: Self-pay | Admitting: Family Medicine

## 2021-03-01 NOTE — Telephone Encounter (Signed)
Pt needs a physical appointment for further refills 

## 2021-03-30 ENCOUNTER — Other Ambulatory Visit: Payer: Self-pay | Admitting: Family Medicine

## 2021-04-11 ENCOUNTER — Telehealth: Payer: Self-pay | Admitting: Family Medicine

## 2021-04-11 DIAGNOSIS — I10 Essential (primary) hypertension: Secondary | ICD-10-CM

## 2021-04-11 NOTE — Telephone Encounter (Signed)
Pt is calling and last seen 02-05-2021 pt was dx with covid . Pt would like refills #90 w/refills on amLODipine-valsartan (EXFORGE) 10-160 MG tablet, atorvastatin (LIPITOR) 20 MG tablet, FLUoxetine (PROZAC) 40 MG capsule  [id number] [place], [place] - [address] [place] Phone:  [phone]  Fax:  [phone]

## 2021-04-12 MED ORDER — AMLODIPINE BESYLATE-VALSARTAN 10-160 MG PO TABS: 1.0000 | ORAL_TABLET | Freq: Every day | ORAL | 0 refills | Status: DC

## 2021-04-12 MED ORDER — ATORVASTATIN CALCIUM 20 MG PO TABS: 20.0000 mg | ORAL_TABLET | Freq: Every day | ORAL | 0 refills | Status: DC

## 2021-04-12 MED ORDER — FLUOXETINE HCL 40 MG PO CAPS: 80.0000 mg | ORAL_CAPSULE | Freq: Every day | ORAL | 0 refills | Status: DC

## 2021-04-12 NOTE — Telephone Encounter (Signed)
30 day supply sent to pharmacy.    Last labs, physical 09/23/2019. Spoke with patient, she will call office to schedule physical with labs

## 2021-04-19 ENCOUNTER — Ambulatory Visit (INDEPENDENT_AMBULATORY_CARE_PROVIDER_SITE_OTHER): Payer: Managed Care, Other (non HMO) | Admitting: Family Medicine

## 2021-04-19 ENCOUNTER — Encounter: Payer: Self-pay | Admitting: Family Medicine

## 2021-04-19 VITALS — BP 110/70 | HR 85 | Temp 99.2°F | Ht 63.75 in | Wt 159.0 lb

## 2021-04-19 DIAGNOSIS — Z0000 Encounter for general adult medical examination without abnormal findings: Secondary | ICD-10-CM | POA: Diagnosis not present

## 2021-04-19 DIAGNOSIS — I10 Essential (primary) hypertension: Secondary | ICD-10-CM

## 2021-04-19 LAB — HEPATIC FUNCTION PANEL
ALT: 20 U/L (ref 0–35)
AST: 25 U/L (ref 0–37)
Albumin: 4.4 g/dL (ref 3.5–5.2)
Alkaline Phosphatase: 63 U/L (ref 39–117)
Bilirubin, Direct: 0.1 mg/dL (ref 0.0–0.3)
Total Bilirubin: 0.9 mg/dL (ref 0.2–1.2)
Total Protein: 7.3 g/dL (ref 6.0–8.3)

## 2021-04-19 LAB — CBC WITH DIFFERENTIAL/PLATELET
Basophils Absolute: 0 10*3/uL (ref 0.0–0.1)
Basophils Relative: 1.1 % (ref 0.0–3.0)
Eosinophils Absolute: 0.1 10*3/uL (ref 0.0–0.7)
Eosinophils Relative: 1.7 % (ref 0.0–5.0)
HCT: 41.4 % (ref 36.0–46.0)
Hemoglobin: 13.4 g/dL (ref 12.0–15.0)
Lymphocytes Relative: 40.3 % (ref 12.0–46.0)
Lymphs Abs: 1.6 10*3/uL (ref 0.7–4.0)
MCHC: 32.4 g/dL (ref 30.0–36.0)
MCV: 91.5 fl (ref 78.0–100.0)
Monocytes Absolute: 0.4 10*3/uL (ref 0.1–1.0)
Monocytes Relative: 9.8 % (ref 3.0–12.0)
Neutro Abs: 1.9 10*3/uL (ref 1.4–7.7)
Neutrophils Relative %: 47.1 % (ref 43.0–77.0)
Platelets: 245 10*3/uL (ref 150.0–400.0)
RBC: 4.52 Mil/uL (ref 3.87–5.11)
RDW: 15.3 % (ref 11.5–15.5)
WBC: 4 10*3/uL (ref 4.0–10.5)

## 2021-04-19 LAB — LIPID PANEL
Cholesterol: 248 mg/dL — ABNORMAL HIGH (ref 0–200)
HDL: 90.4 mg/dL (ref >39.00)
LDL Cholesterol: 143 mg/dL — ABNORMAL HIGH (ref 0–99)
NonHDL: 157.59
Total CHOL/HDL Ratio: 3
Triglycerides: 73 mg/dL (ref 0.0–149.0)
VLDL: 14.6 mg/dL (ref 0.0–40.0)

## 2021-04-19 LAB — BASIC METABOLIC PANEL
BUN: 17 mg/dL (ref 6–23)
CO2: 33 mEq/L — ABNORMAL HIGH (ref 19–32)
Calcium: 9.6 mg/dL (ref 8.4–10.5)
Chloride: 102 mEq/L (ref 96–112)
Creatinine, Ser: 1.04 mg/dL (ref 0.40–1.20)
GFR: 59.72 mL/min — ABNORMAL LOW (ref >60.00)
Glucose, Bld: 95 mg/dL (ref 70–99)
Potassium: 4.2 mEq/L (ref 3.5–5.1)
Sodium: 138 mEq/L (ref 135–145)

## 2021-04-19 LAB — TSH: TSH: 0.9 u[IU]/mL (ref 0.35–5.50)

## 2021-04-19 LAB — HEMOGLOBIN A1C: Hgb A1c MFr Bld: 5.7 % (ref 4.6–6.5)

## 2021-04-19 MED ORDER — VENLAFAXINE HCL ER 150 MG PO CP24: 150.0000 mg | ORAL_CAPSULE | Freq: Every day | ORAL | 3 refills | Status: DC

## 2021-04-19 MED ORDER — ATORVASTATIN CALCIUM 20 MG PO TABS: 20.0000 mg | ORAL_TABLET | Freq: Every day | ORAL | 3 refills | Status: DC

## 2021-04-19 MED ORDER — AMLODIPINE BESYLATE-VALSARTAN 5-160 MG PO TABS: 1.0000 | ORAL_TABLET | Freq: Every day | ORAL | 3 refills | Status: DC

## 2021-04-19 NOTE — Progress Notes (Signed)
° °  Subjective:    Patient ID: Kristine [name], female    DOB: May 16, 1965, 56 y.o.   MRN: [id number]  HPI Here for a well exam. She feels fine. She asks if her BP medication can be reduced. Her Bp at home averages in the 120s over 70s.    Review of Systems  Constitutional: Negative.   HENT: Negative.    Eyes: Negative.   Respiratory: Negative.    Cardiovascular: Negative.   Gastrointestinal: Negative.   Genitourinary:  Negative for decreased urine volume, difficulty urinating, dyspareunia, dysuria, enuresis, flank pain, frequency, hematuria, pelvic pain and urgency.  Musculoskeletal: Negative.   Skin: Negative.   Neurological: Negative.  Negative for headaches.  Psychiatric/Behavioral: Negative.        Objective:   Physical Exam Constitutional:      General: She is not in acute distress.    Appearance: Normal appearance. She is well-developed.  HENT:     Head: Normocephalic and atraumatic.     Right Ear: External ear normal.     Left Ear: External ear normal.     Nose: Nose normal.     Mouth/Throat:     Pharynx: No oropharyngeal exudate.  Eyes:     General: No scleral icterus.    Conjunctiva/sclera: Conjunctivae normal.     Pupils: Pupils are equal, round, and reactive to light.  Neck:     Thyroid: No thyromegaly.     Vascular: No JVD.  Cardiovascular:     Rate and Rhythm: Normal rate and regular rhythm.     Heart sounds: Normal heart sounds. No murmur heard.   No friction rub. No gallop.  Pulmonary:     Effort: Pulmonary effort is normal. No respiratory distress.     Breath sounds: Normal breath sounds. No wheezing or rales.  Chest:     Chest wall: No tenderness.  Abdominal:     General: Bowel sounds are normal. There is no distension.     Palpations: Abdomen is soft. There is no mass.     Tenderness: There is no abdominal tenderness. There is no guarding or rebound.  Musculoskeletal:        General: No tenderness. Normal range of motion.     Cervical back:  Normal range of motion and neck supple.  Lymphadenopathy:     Cervical: No cervical adenopathy.  Skin:    General: Skin is warm and dry.     Findings: No erythema or rash.  Neurological:     Mental Status: She is alert and oriented to person, place, and time.     Cranial Nerves: No cranial nerve deficit.     Motor: No abnormal muscle tone.     Coordination: Coordination normal.     Deep Tendon Reflexes: Reflexes are normal and symmetric. Reflexes normal.  Psychiatric:        Behavior: Behavior normal.        Thought Content: Thought content normal.        Judgment: Judgment normal.          Assessment & Plan:  Well exam. We discussed diet and exercise. Get fasting labs. Her HTN is well controled so we will reduced the Exforge to 5-160 once a day. Alysia Penna, MD

## 2021-06-20 ENCOUNTER — Telehealth: Payer: Self-pay | Admitting: Family Medicine

## 2021-06-20 DIAGNOSIS — H538 Other visual disturbances: Secondary | ICD-10-CM

## 2021-06-20 NOTE — Telephone Encounter (Signed)
Pt is calling and needs a referral to see dr bond [facility] [address] phone number [phone]. Pt needs eye exam and having a film over her eyes no blurry vision per pt ?

## 2021-06-20 NOTE — Telephone Encounter (Signed)
[place]. ?

## 2021-06-25 NOTE — Telephone Encounter (Signed)
The referral was done  

## 2021-06-25 NOTE — Telephone Encounter (Signed)
Called patient made aware that referral has been placed.  ?

## 2021-06-25 NOTE — Telephone Encounter (Signed)
Spoke with pt advised that Dr Sarajane Jews has placed the referral requested, pt aware that Dr Doree Albee office will call her for scheduling ?

## 2021-08-02 ENCOUNTER — Ambulatory Visit: Payer: Managed Care, Other (non HMO) | Admitting: Family Medicine

## 2021-08-02 ENCOUNTER — Encounter: Payer: Self-pay | Admitting: Family Medicine

## 2021-08-02 VITALS — BP 98/78 | HR 79 | Temp 98.1°F | Wt 156.4 lb

## 2021-08-02 DIAGNOSIS — B9689 Other specified bacterial agents as the cause of diseases classified elsewhere: Secondary | ICD-10-CM | POA: Diagnosis not present

## 2021-08-02 DIAGNOSIS — N760 Acute vaginitis: Secondary | ICD-10-CM

## 2021-08-02 MED ORDER — METRONIDAZOLE 500 MG PO TABS: 500.0000 mg | ORAL_TABLET | Freq: Three times a day (TID) | ORAL | 0 refills | Status: AC

## 2021-08-02 NOTE — Progress Notes (Signed)
   Subjective:    Patient ID: ALBERTA LENHARD, female    DOB: September 26, 1965, 56 y.o.   MRN: [id number]  HPI Here for 5 days of a brownish vaginal DC that has a foul odor. No pain or itching. She has never had this before. No recent changes in her routine, no hot tubs, etc. She does not douche.    Review of Systems  Constitutional: Negative.   Respiratory: Negative.    Cardiovascular: Negative.   Genitourinary:  Positive for vaginal discharge.      Objective:   Physical Exam Constitutional:      Appearance: Normal appearance.  Cardiovascular:     Rate and Rhythm: Normal rate and regular rhythm.     Pulses: Normal pulses.     Heart sounds: Normal heart sounds.  Pulmonary:     Effort: Pulmonary effort is normal.     Breath sounds: Normal breath sounds.  Neurological:     Mental Status: She is alert.          Assessment & Plan:  Bacterial vaginitis. Treat with 7 days of Metronidazole. Recheck as needed.  Alysia Penna, MD

## 2021-12-21 ENCOUNTER — Encounter: Payer: Self-pay | Admitting: Gastroenterology

## 2022-03-05 ENCOUNTER — Telehealth: Payer: Managed Care, Other (non HMO) | Admitting: Family

## 2022-03-05 VITALS — Temp 100.0°F

## 2022-03-05 DIAGNOSIS — R051 Acute cough: Secondary | ICD-10-CM

## 2022-03-05 DIAGNOSIS — R0981 Nasal congestion: Secondary | ICD-10-CM

## 2022-03-05 DIAGNOSIS — R509 Fever, unspecified: Secondary | ICD-10-CM

## 2022-03-05 DIAGNOSIS — U071 COVID-19: Secondary | ICD-10-CM

## 2022-03-05 LAB — POC COVID19 BINAXNOW: SARS Coronavirus 2 Ag: POSITIVE — AB

## 2022-03-05 MED ORDER — NIRMATRELVIR/RITONAVIR (PAXLOVID)TABLET: 3.0000 | ORAL_TABLET | Freq: Two times a day (BID) | ORAL | 0 refills | Status: AC

## 2022-03-05 MED ORDER — PROMETHAZINE-DM 6.25-15 MG/5ML PO SYRP: 5.0000 mL | ORAL_SOLUTION | Freq: Four times a day (QID) | ORAL | 0 refills | Status: DC | PRN

## 2022-03-05 NOTE — Progress Notes (Signed)
Virtual Visit via Video   I connected with patient on 03/05/22 at  1:45 PM EST by a video enabled telemedicine application and verified that I am speaking with the correct person using two identifiers.  Location patient: Home Location provider: Public house manager, Office Persons participating in the virtual visit: Patient, Provider, CMA  I discussed the limitations of evaluation and management by telemedicine and the availability of in person appointments. The patient expressed understanding and agreed to proceed.  Subjective:   HPI:   57 year old [name], patient of Dr. Sarajane Jews presents virtually after testing positive for COVID. She has been having symptoms of cough, congestion, fatigue x 3 days.She is taking OTC medication w/o much relief. Is interested in Paxlovid  ROS:   See pertinent positives and negatives per HPI.  Patient Active Problem List   Diagnosis Date Noted   COVID-19 virus infection 02/05/2021   Carpal tunnel syndrome 10/20/2012   Nausea & vomiting 06/06/2011   Allergic rhinitis, cause unspecified 06/06/2011   Acute sinusitis with symptoms greater than 10 days 06/06/2011   Tobacco user 06/06/2011   OVERACTIVE BLADDER 04/03/2010   LOW BACK PAIN, ACUTE 02/08/2010   BACK PAIN 02/08/2010   URINARY FREQUENCY, HX OF 02/08/2010   CIGARETTE SMOKER 07/25/2009   NAUSEA AND VOMITING 07/25/2009   ABDOMINAL PAIN, EPIGASTRIC 05/Kristine [name]/2011   VIRAL INFECTION 03/07/2008   SYNCOPE 03/01/2008   GASTROENTERITIS, ACUTE 01/04/2008   ACUTE BRONCHITIS 12/23/2007   SACROILIITIS 12/23/2007   HYPERLIPIDEMIA 05/Kristine [name]/2009   DEPRESSION 05/Kristine [name]/2009   Essential hypertension 05/Kristine [name]/2009   GERD 05/Kristine [name]/2009   MENOPAUSAL SYNDROME 05/Kristine [name]/2009    Social History   Tobacco Use   Smoking status: Every Day    Packs/day: 0.50    Types: Cigarettes   Smokeless tobacco: Never  Substance Use Topics   Alcohol use: Yes    Alcohol/week: 1.0 standard drink of alcohol    Types: 1 Standard drinks or  equivalent per week    Comment: occ    Current Outpatient Medications:    amLODipine-valsartan (EXFORGE) 5-160 MG tablet, Take 1 tablet by mouth daily., Disp: 90 tablet, Rfl: 3   atorvastatin (LIPITOR) 20 MG tablet, Take 1 tablet (20 mg total) by mouth daily., Disp: 90 tablet, Rfl: 3   Fesoterodine Fumarate (TOVIAZ PO), Take by mouth., Disp: , Rfl:    nirmatrelvir/ritonavir (PAXLOVID) 20 x 150 MG & 10 x '100MG'$  TABS, Take 3 tablets by mouth 2 (two) times daily for 5 days. (Take nirmatrelvir 150 mg two tablets twice daily for 5 days and ritonavir 100 mg one tablet twice daily for 5 days) Patient GFR is 60, Disp: 30 tablet, Rfl: 0   promethazine-dextromethorphan (PROMETHAZINE-DM) 6.25-15 MG/5ML syrup, Take 5 mLs by mouth 4 (four) times daily as needed., Disp: 118 mL, Rfl: 0   venlafaxine XR (EFFEXOR XR) 150 MG 24 hr capsule, Take 1 capsule (150 mg total) by mouth daily with breakfast., Disp: 90 capsule, Rfl: 3   Vibegron (GEMTESA) 75 MG TABS, Take by mouth once., Disp: , Rfl:    fluconazole (DIFLUCAN) 150 MG tablet, Take 1 tablet (150 mg total) by mouth daily. (Patient not taking: Reported on 08/02/2021), Disp: 1 tablet, Rfl: 2   traMADol (ULTRAM) 50 MG tablet, TAKE 2 TABLETS (100 MG TOTAL) BY MOUTH EVERY 6 (SIX) HOURS AS NEEDED FOR MODERATE PAIN. (Patient not taking: Reported on 03/05/2022), Disp: 60 tablet, Rfl: 5   trimethoprim (TRIMPEX) 100 MG tablet, TAKE 1 TABLET BY MOUTH EVERYDAY AT BEDTIME (Patient not taking: Reported  on 03/05/2022), Disp: , Rfl: 6  Allergies  Allergen Reactions   Ciprofloxacin Hives   Codeine Itching   Lipitor [Atorvastatin Calcium] Other (See Comments)    Myalgias     Objective:   Temp 100 F (37.8 C) (Temporal)   Patient is well-developed, well-nourished in no acute distress.  Resting comfortably at home.  Head is normocephalic, atraumatic.  No labored breathing.  Speech is clear and coherent with logical content.  Patient is alert and oriented at baseline.     Assessment and Plan:    Selinda was seen today for covid positive.  Diagnoses and all orders for this visit:  COVID-19 virus infection  Nasal congestion -     POC COVID-19 BinaxNow  Acute cough -     POC COVID-19 BinaxNow  Fever, unspecified fever cause -     POC COVID-19 BinaxNow  Other orders -     nirmatrelvir/ritonavir (PAXLOVID) 20 x 150 MG & 10 x '100MG'$  TABS; Take 3 tablets by mouth 2 (two) times daily for 5 days. (Take nirmatrelvir 150 mg two tablets twice daily for 5 days and ritonavir 100 mg one tablet twice daily for 5 days) Patient GFR is 60 -     promethazine-dextromethorphan (PROMETHAZINE-DM) 6.25-15 MG/5ML syrup; Take 5 mLs by mouth 4 (four) times daily as needed.    COVID-19 precautions given per CDC guidance. Call the office if symptoms worsen or persist. Recheck as scheduled.   Kennyth Arnold, FNP 03/05/2022  Time spent with the patient: 20 minutes, of which >50% was spent in obtaining information about symptoms, reviewing previous labs, evaluations, and treatments, counseling about condition (please see the discussed topics above), and developing a plan to further investigate it; had a number of questions which I addressed.

## 2022-03-14 ENCOUNTER — Telehealth: Payer: Self-pay | Admitting: Family Medicine

## 2022-03-14 NOTE — Telephone Encounter (Signed)
Pt seen on 03/05/22 for covid, patient has residual cough and requesting cough syrup or something to help with cough. Productive cough

## 2022-03-15 ENCOUNTER — Other Ambulatory Visit: Payer: Self-pay | Admitting: Family Medicine

## 2022-03-15 DIAGNOSIS — I10 Essential (primary) hypertension: Secondary | ICD-10-CM

## 2022-03-15 MED ORDER — HYDROCODONE BIT-HOMATROP MBR 5-1.5 MG/5ML PO SOLN: 5.0000 mL | ORAL | 0 refills | Status: DC | PRN

## 2022-03-15 NOTE — Telephone Encounter (Signed)
Done

## 2022-03-15 NOTE — Telephone Encounter (Signed)
Called patient informed [facility]

## 2022-03-15 NOTE — Addendum Note (Signed)
Addended by: Alysia Penna A on: 03/15/2022 02:18 PM   Modules accepted: Orders

## 2022-04-30 ENCOUNTER — Telehealth: Payer: Self-pay | Admitting: Family Medicine

## 2022-04-30 NOTE — Telephone Encounter (Signed)
Patient is having an outbreak of shingles, says head, body itching and burning. Has not slept because of symptoms. Requesting something to help with the symptoms. Please call patient with an update.

## 2022-04-30 NOTE — Telephone Encounter (Signed)
Attempted to call pt twice regarding scheduling appointment for tomorrow with no success. Send pt a detailed MyChart message

## 2022-05-01 NOTE — Telephone Encounter (Addendum)
Pt is unable to get off work and would like something sent to pharm  [id number] [place] [place] [address]: [phone] Fax: [phone]

## 2022-05-02 ENCOUNTER — Other Ambulatory Visit: Payer: Self-pay

## 2022-05-02 ENCOUNTER — Other Ambulatory Visit: Payer: Self-pay | Admitting: Family Medicine

## 2022-05-02 DIAGNOSIS — I10 Essential (primary) hypertension: Secondary | ICD-10-CM

## 2022-05-02 MED ORDER — VALACYCLOVIR HCL 1 G PO TABS: 1000.0000 mg | ORAL_TABLET | Freq: Three times a day (TID) | ORAL | 0 refills | Status: DC

## 2022-05-02 NOTE — Telephone Encounter (Signed)
Pt Rx for Valtrex was sent to pt pharmacy per Dr Sarajane Jews

## 2022-05-02 NOTE — Telephone Encounter (Signed)
Call in Valtrex 1000 mg TID for 10 days

## 2022-06-10 ENCOUNTER — Other Ambulatory Visit: Payer: Self-pay | Admitting: Family Medicine

## 2022-06-10 DIAGNOSIS — I10 Essential (primary) hypertension: Secondary | ICD-10-CM

## 2022-07-12 ENCOUNTER — Other Ambulatory Visit: Payer: Self-pay | Admitting: Family Medicine

## 2022-07-16 ENCOUNTER — Other Ambulatory Visit: Payer: Self-pay | Admitting: Family Medicine

## 2022-07-16 DIAGNOSIS — I10 Essential (primary) hypertension: Secondary | ICD-10-CM

## 2022-08-16 ENCOUNTER — Other Ambulatory Visit: Payer: Self-pay | Admitting: Family Medicine

## 2022-08-16 DIAGNOSIS — I10 Essential (primary) hypertension: Secondary | ICD-10-CM

## 2022-08-17 ENCOUNTER — Other Ambulatory Visit: Payer: Self-pay | Admitting: Family Medicine

## 2022-09-12 ENCOUNTER — Other Ambulatory Visit: Payer: Self-pay | Admitting: Family Medicine

## 2022-09-12 DIAGNOSIS — I10 Essential (primary) hypertension: Secondary | ICD-10-CM

## 2022-09-13 ENCOUNTER — Other Ambulatory Visit: Payer: Self-pay | Admitting: Family Medicine

## 2022-10-16 ENCOUNTER — Encounter: Payer: Self-pay | Admitting: Family Medicine

## 2022-10-16 ENCOUNTER — Telehealth: Payer: Managed Care, Other (non HMO) | Admitting: Family Medicine

## 2022-10-16 ENCOUNTER — Ambulatory Visit: Payer: Managed Care, Other (non HMO) | Admitting: Family Medicine

## 2022-10-16 VITALS — BP 120/80 | HR 95 | Temp 98.5°F | Wt 171.0 lb

## 2022-10-16 DIAGNOSIS — I10 Essential (primary) hypertension: Secondary | ICD-10-CM | POA: Diagnosis not present

## 2022-10-16 DIAGNOSIS — R519 Headache, unspecified: Secondary | ICD-10-CM | POA: Diagnosis not present

## 2022-10-16 DIAGNOSIS — B349 Viral infection, unspecified: Secondary | ICD-10-CM | POA: Diagnosis not present

## 2022-10-16 DIAGNOSIS — R52 Pain, unspecified: Secondary | ICD-10-CM

## 2022-10-16 DIAGNOSIS — R197 Diarrhea, unspecified: Secondary | ICD-10-CM

## 2022-10-16 DIAGNOSIS — R252 Cramp and spasm: Secondary | ICD-10-CM

## 2022-10-16 LAB — POC COVID19 BINAXNOW: SARS Coronavirus 2 Ag: NEGATIVE

## 2022-10-16 MED ORDER — ATORVASTATIN CALCIUM 20 MG PO TABS: 20.0000 mg | ORAL_TABLET | Freq: Every day | ORAL | 3 refills | Status: DC

## 2022-10-16 MED ORDER — VENLAFAXINE HCL ER 150 MG PO CP24: 150.0000 mg | ORAL_CAPSULE | Freq: Every day | ORAL | 3 refills | Status: DC

## 2022-10-16 NOTE — Progress Notes (Signed)
   Subjective:    Patient ID: Kristine [name], female    DOB: [birth year], 57 y.o.   MRN: [id number]  HPI Here for 2 issues. First she has had daily leg cramps for the past year, often worse at night. They feel better if she gets up and walks around. Also one week ago she developed diarrhea, weakness, headache, and nausea without vomiting. No abdominal pain. No fever. No urinary symptoms. She is drinking water and taking Ibuprofen.    Review of Systems  Constitutional:  Positive for fatigue. Negative for fever.  HENT: Negative.    Respiratory: Negative.    Cardiovascular: Negative.   Gastrointestinal:  Positive for diarrhea and nausea. Negative for abdominal distention, abdominal pain, blood in stool, constipation and vomiting.  Genitourinary: Negative.        Objective:   Physical Exam Constitutional:      Appearance: She is ill-appearing.  Cardiovascular:     Rate and Rhythm: Normal rate and regular rhythm.     Pulses: Normal pulses.     Heart sounds: Normal heart sounds.  Pulmonary:     Effort: Pulmonary effort is normal.     Breath sounds: Normal breath sounds.  Abdominal:     General: Abdomen is flat. Bowel sounds are normal. There is no distension.     Palpations: Abdomen is soft. There is no mass.     Tenderness: There is no abdominal tenderness. There is no right CVA tenderness, left CVA tenderness, guarding or rebound.     Hernia: No hernia is present.  Musculoskeletal:        General: No swelling or tenderness.  Neurological:     Mental Status: She is alert.           Assessment & Plan:  She has had bilateral leg cramps for some time, and I suggested she take 2 or 3 OTC magnesium tablets daily. She also has an acute viral illness (not Covid), that seems to have peaked. She will rest and drink water and Gatorade. We wrote her out of work from 10-11-22 through 10-20-22.  [name], MD

## 2022-10-18 ENCOUNTER — Other Ambulatory Visit: Payer: Self-pay | Admitting: Family Medicine

## 2022-10-18 ENCOUNTER — Telehealth: Payer: Self-pay | Admitting: Family Medicine

## 2022-10-18 NOTE — Telephone Encounter (Signed)
Pt was seen on 8-14 and needs #90 amLODipine-valsartan (EXFORGE) 5-160 MG tablet send to  [id number] [place], [place] - [address] Phone: [phone]  Fax: [phone]

## 2022-10-18 NOTE — Telephone Encounter (Signed)
Both Rx sent.

## 2022-10-21 ENCOUNTER — Telehealth: Payer: Self-pay | Admitting: Family Medicine

## 2022-10-21 NOTE — Telephone Encounter (Signed)
Pt called back and would like her Doctors Note to reflect the "Return to Work" date of:  Wednesday, 10/23/22. Pt is asking that letter please be uploaded to her Mychart.  Please advise.

## 2022-10-21 NOTE — Telephone Encounter (Signed)
Requesting work excuse to be updated to reflect her returning to work 10/22/22. Says letter can be loaded to [employer]

## 2022-10-22 NOTE — Telephone Encounter (Signed)
Pt letter was uploaded via MyChart, pt notified

## 2022-10-22 NOTE — Telephone Encounter (Signed)
Checking on progress of this request.

## 2022-10-22 NOTE — Telephone Encounter (Signed)
Yes please update the letter

## 2022-10-30 NOTE — Telephone Encounter (Signed)
Pt spouse call and stated the pharmacy didn't receive the refill and also want a 90 day supply and not 30 day.

## 2022-10-31 ENCOUNTER — Other Ambulatory Visit: Payer: Self-pay

## 2022-10-31 MED ORDER — AMLODIPINE BESYLATE-VALSARTAN 5-160 MG PO TABS: 1.0000 | ORAL_TABLET | Freq: Every day | ORAL | 0 refills | Status: DC

## 2022-10-31 NOTE — Telephone Encounter (Signed)
Rx sent 

## 2023-01-28 ENCOUNTER — Other Ambulatory Visit: Payer: Self-pay | Admitting: Family Medicine

## 2023-04-07 ENCOUNTER — Telehealth: Payer: Self-pay | Admitting: Family Medicine

## 2023-04-07 DIAGNOSIS — H5711 Ocular pain, right eye: Secondary | ICD-10-CM

## 2023-04-07 NOTE — Telephone Encounter (Signed)
Pt was seen at the [facility] on 04/06/23 for Eye problem. Please advise

## 2023-04-07 NOTE — Telephone Encounter (Signed)
I did an urgent referral to Ophthalmology

## 2023-04-07 NOTE — Telephone Encounter (Signed)
Pt's husband states pt would like a referral to an eye dr.

## 2023-04-08 NOTE — Telephone Encounter (Signed)
Left a message for pt regarding referral to an eye doctor

## 2023-05-05 ENCOUNTER — Telehealth: Payer: Self-pay | Admitting: Family Medicine

## 2023-05-05 NOTE — Telephone Encounter (Unsigned)
 Copied from CRM [id number]. Topic: Appointments - Scheduling Inquiry for Clinic >> May 05, 2023  4:06 PM Corin V wrote: Reason for CRM: Patient would like to know if Dr. Clent Ridges would be willing to accept her mother as a new patient as she is not happy with her current provider. Mother's MRN is [id number].

## 2023-05-06 NOTE — Telephone Encounter (Signed)
 Unfortunately I am too full to see her right now

## 2023-05-07 NOTE — Telephone Encounter (Signed)
 Spoke with pt advised of Dr Clent Ridges message pt advised to contact the office to see if there is any Dr accepting new pt

## 2023-05-09 ENCOUNTER — Other Ambulatory Visit: Payer: Self-pay | Admitting: Family Medicine

## 2023-08-09 ENCOUNTER — Other Ambulatory Visit: Payer: Self-pay | Admitting: Family Medicine

## 2023-09-04 ENCOUNTER — Ambulatory Visit: Payer: Self-pay

## 2023-09-04 ENCOUNTER — Telehealth: Admitting: Physician Assistant

## 2023-09-04 DIAGNOSIS — J208 Acute bronchitis due to other specified organisms: Secondary | ICD-10-CM | POA: Diagnosis not present

## 2023-09-04 DIAGNOSIS — B9689 Other specified bacterial agents as the cause of diseases classified elsewhere: Secondary | ICD-10-CM | POA: Diagnosis not present

## 2023-09-04 MED ORDER — BENZONATATE 100 MG PO CAPS
100.0000 mg | ORAL_CAPSULE | Freq: Three times a day (TID) | ORAL | 0 refills | Status: AC | PRN
Start: 2023-09-04 — End: ?

## 2023-09-04 MED ORDER — AZITHROMYCIN 250 MG PO TABS: ORAL_TABLET | ORAL | 0 refills | Status: AC

## 2023-09-04 NOTE — Patient Instructions (Signed)
 Kristine [name], thank you for joining [name], PA-C for today's virtual visit.  While this provider is not your primary care provider (PCP), if your PCP is located in our provider database this encounter information will be shared with them immediately following your visit.   A [facility] MyChart account gives you access to today's visit and all your visits, tests, and labs performed at [facility]  click here if you don't have a [facility] MyChart account or go to [email or web address]  Consent: (Patient) Kristine [name] provided verbal consent for this virtual visit at the beginning of the encounter.  Current Medications:  Current Outpatient Medications:    azithromycin  (ZITHROMAX ) 250 MG tablet, Take 2 tablets on day 1, then 1 tablet daily on days 2 through 5, Disp: 6 tablet, Rfl: 0   benzonatate (TESSALON) 100 MG capsule, Take 1-2 capsules (100-200 mg total) by mouth 3 (three) times daily as needed., Disp: 30 capsule, Rfl: 0   amLODipine -valsartan  (EXFORGE ) 5-160 MG tablet, TAKE 1 TABLET BY MOUTH EVERY DAY, Disp: 90 tablet, Rfl: 0   atorvastatin  (LIPITOR) 20 MG tablet, Take 1 tablet (20 mg total) by mouth daily., Disp: 90 tablet, Rfl: 3   Fesoterodine Fumarate (TOVIAZ PO), Take by mouth., Disp: , Rfl:    trimethoprim  (TRIMPEX ) 100 MG tablet, Take 100 mg by mouth daily., Disp: , Rfl:    valACYclovir  (VALTREX ) 1000 MG tablet, Take 1 tablet (1,000 mg total) by mouth 3 (three) times daily., Disp: 30 tablet, Rfl: 0   venlafaxine  XR (EFFEXOR -XR) 150 MG 24 hr capsule, Take 1 capsule (150 mg total) by mouth daily with breakfast., Disp: 90 capsule, Rfl: 3   Vibegron (GEMTESA) 75 MG TABS, Take by mouth once., Disp: , Rfl:    Medications ordered in this encounter:  Meds ordered this encounter  Medications   azithromycin  (ZITHROMAX ) 250 MG tablet    Sig: Take 2 tablets on day 1, then 1 tablet daily on days 2 through 5    Dispense:  6 tablet    Refill:  0     Supervising Provider:   LAMPTEY, PHILIP O [[id number]]   benzonatate (TESSALON) 100 MG capsule    Sig: Take 1-2 capsules (100-200 mg total) by mouth 3 (three) times daily as needed.    Dispense:  30 capsule    Refill:  0    Supervising Provider:   BLAISE ALEENE MALVA [[id number]]     *If you need refills on other medications prior to your next appointment, please contact your pharmacy*  Follow-Up: Call back or seek an in-person evaluation if the symptoms worsen or if the condition fails to improve as anticipated.  [facility] [phone]  Other Instructions  Acute Bronchitis, Adult  Acute bronchitis is sudden inflammation of the main airways (bronchi) that come off the windpipe (trachea) in the lungs. The swelling causes the airways to get smaller and make more mucus than normal. This can make it hard to breathe and can cause coughing or noisy breathing (wheezing). Acute bronchitis may last several weeks. The cough may last longer. Allergies, asthma, and exposure to smoke may make the condition worse. What are the causes? This condition can be caused by germs and by substances that irritate the lungs, including: Cold and flu viruses. The most common cause of this condition is the virus that causes the common cold. Bacteria. This is less common. Breathing in substances that irritate the lungs, including: Smoke from cigarettes and other forms  of tobacco. Dust and pollen. Fumes from household cleaning products, gases, or burned fuel. Indoor or outdoor air pollution. What increases the risk? The following factors may make you more likely to develop this condition: A weak body's defense system, also called the immune system. A condition that affects your lungs and breathing, such as asthma. What are the signs or symptoms? Common symptoms of this condition include: Coughing. This may bring up clear, yellow, or green mucus from your lungs (sputum). Wheezing. Runny or stuffy  nose. Having too much mucus in your lungs (chest congestion). Shortness of breath. Aches and pains, including sore throat or chest. How is this diagnosed? This condition is usually diagnosed based on: Your symptoms and medical history. A physical exam. You may also have other tests, including tests to rule out other conditions, such as pneumonia. These tests include: A test of lung function. Test of a mucus sample to look for the presence of bacteria. Tests to check the oxygen level in your blood. Blood tests. Chest X-ray. How is this treated? Most cases of acute bronchitis clear up over time without treatment. Your health care provider may recommend: Drinking more fluids to help thin your mucus so it is easier to cough up. Taking inhaled medicine (inhaler) to improve air flow in and out of your lungs. Using a vaporizer or a humidifier. These are machines that add water to the air to help you breathe better. Taking a medicine that thins mucus and clears congestion (expectorant). Taking a medicine that prevents or stops coughing (cough suppressant). It is not common to take an antibiotic medicine for this condition. Follow these instructions at home:  Take over-the-counter and prescription medicines only as told by your health care provider. Use an inhaler, vaporizer, or humidifier as told by your health care provider. Take two teaspoons (10 mL) of honey at bedtime to lessen coughing at night. Drink enough fluid to keep your urine pale yellow. Do not use any products that contain nicotine or tobacco. These products include cigarettes, chewing tobacco, and vaping devices, such as e-cigarettes. If you need help quitting, ask your health care provider. Get plenty of rest. Return to your normal activities as told by your health care provider. Ask your health care provider what activities are safe for you. Keep all follow-up visits. This is important. How is this prevented? To lower your  risk of getting this condition again: Wash your hands often with soap and water for at least 20 seconds. If soap and water are not available, use hand sanitizer. Avoid contact with people who have cold symptoms. Try not to touch your mouth, nose, or eyes with your hands. Avoid breathing in smoke or chemical fumes. Breathing smoke or chemical fumes will make your condition worse. Get the flu shot every year. Contact a health care provider if: Your symptoms do not improve after 2 weeks. You have trouble coughing up the mucus. Your cough keeps you awake at night. You have a fever. Get help right away if you: Cough up blood. Feel pain in your chest. Have severe shortness of breath. Faint or keep feeling like you are going to faint. Have a severe headache. Have a fever or chills that get worse. These symptoms may represent a serious problem that is an emergency. Do not wait to see if the symptoms will go away. Get medical help right away. Call your local emergency services (911 in the [place] Do not drive yourself to the hospital. Summary Acute bronchitis is inflammation  of the main airways (bronchi) that come off the windpipe (trachea) in the lungs. The swelling causes the airways to get smaller and make more mucus than normal. Drinking more fluids can help thin your mucus so it is easier to cough up. Take over-the-counter and prescription medicines only as told by your health care provider. Do not use any products that contain nicotine or tobacco. These products include cigarettes, chewing tobacco, and vaping devices, such as e-cigarettes. If you need help quitting, ask your health care provider. Contact a health care provider if your symptoms do not improve after 2 weeks. This information is not intended to replace advice given to you by your health care provider. Make sure you discuss any questions you have with your health care provider. Document Revised: 05/31/2021 Document Reviewed:  06/21/2020 Elsevier Patient Education  2024 Elsevier Inc.   If you have been instructed to have an in-person evaluation today at a local Urgent Care facility, please use the link below. It will take you to a list of all of our available [facility], including address, phone number and hours of operation. Please do not delay care.  [facility]  If you or a family member do not have a primary care provider, use the link below to schedule a visit and establish care. When you choose a [facility] primary care physician or advanced practice provider, you gain a long-term partner in health. Find a Primary Care Provider  Learn more about [facility] in-office and virtual care options:  - Get Care Now

## 2023-09-04 NOTE — Telephone Encounter (Signed)
 Noted

## 2023-09-04 NOTE — Telephone Encounter (Signed)
 Copied from CRM [id number]. Topic: Clinical - Red Word Triage >> Sep 04, 2023 11:34 AM [name] wrote: [facility] that prompted transfer to Nurse Triage: Throat pain and chest pain when coughing, phlegm is turning a dark brown. Symptoms going on for 3 days.    FYI Only or Action Required?: FYI only for provider.  Patient was last seen in primary care on 10/16/2022 by Kristine [name] LABOR, MD. Called Nurse Triage reporting Cough. Symptoms began 3 days. Interventions attempted: OTC medications: Theraflu. Symptoms are: gradually worsening.  Triage Disposition: See Physician Within 24 Hours  Patient/caregiver understands and will follow disposition?: Yes    Reason for Disposition  SEVERE coughing spells (e.g., whooping sound after coughing, vomiting after coughing)  Answer Assessment - Initial Assessment Questions 1. ONSET: When did the cough begin?      3 days 2. SEVERITY: How bad is the cough today?      Moderate to severe 3. SPUTUM: Describe the color of your sputum (none, dry cough; clear, white, yellow, green)     Brown 4. HEMOPTYSIS: Are you coughing up any blood? If so ask: How much? (flecks, streaks, tablespoons, etc.)     No 5. DIFFICULTY BREATHING: Are you having difficulty breathing? If Yes, ask: How bad is it? (e.g., mild, moderate, severe)    - MILD: No SOB at rest, mild SOB with walking, speaks normally in sentences, can lie down, no retractions, pulse < 100.    - MODERATE: SOB at rest, SOB with minimal exertion and prefers to sit, cannot lie down flat, speaks in phrases, mild retractions, audible wheezing, pulse 100-120.    - SEVERE: Very SOB at rest, speaks in single words, struggling to breathe, sitting hunched forward, retractions, pulse > 120      No 6. FEVER: Do you have a fever? If Yes, ask: What is your temperature, how was it measured, and when did it start?     No 7. CARDIAC HISTORY: Do you have any history of heart disease? (e.g., heart attack,  congestive heart failure)      Hypertension  8. LUNG HISTORY: Do you have any history of lung disease?  (e.g., pulmonary embolus, asthma, emphysema)     No 9. PE RISK FACTORS: Do you have a history of blood clots? (or: recent major surgery, recent prolonged travel, bedridden)     No 10. OTHER SYMPTOMS: Do you have any other symptoms? (e.g., runny nose, wheezing, chest pain)       Chest pain with cough  Protocols used: Cough - Acute Productive-A-AH

## 2023-09-04 NOTE — Progress Notes (Signed)
 Virtual Visit Consent   Kristine [name], you are scheduled for a virtual visit with a [facility] provider today. Just as with appointments in the office, your consent must be obtained to participate. Your consent will be active for this visit and any virtual visit you may have with one of our providers in the next 365 days. If you have a MyChart account, a copy of this consent can be sent to you electronically.  As this is a virtual visit, video technology does not allow for your provider to perform a traditional examination. This may limit your provider's ability to fully assess your condition. If your provider identifies any concerns that need to be evaluated in person or the need to arrange testing (such as labs, EKG, etc.), we will make arrangements to do so. Although advances in technology are sophisticated, we cannot ensure that it will always work on either your end or our end. If the connection with a video visit is poor, the visit may have to be switched to a telephone visit. With either a video or telephone visit, we are not always able to ensure that we have a secure connection.  By engaging in this virtual visit, you consent to the provision of healthcare and authorize for your insurance to be billed (if applicable) for the services provided during this visit. Depending on your insurance coverage, you may receive a charge related to this service.  I need to obtain your verbal consent now. Are you willing to proceed with your visit today? Kristine [name] has provided verbal consent on 09/04/2023 for a virtual visit (video or telephone). Kristine CHRISTELLA Dickinson, PA-C  Date: 09/04/2023 1:58 PM   Virtual Visit via Video Note   I, Kristine [name], connected with  Kristine [name]  ([id number], 1965-11-03) on 09/04/23 at  1:45 PM EDT by a video-enabled telemedicine application and verified that I am speaking with the correct person using two identifiers.  Location: Patient: Virtual Visit  Location Patient: Home Provider: Virtual Visit Location Provider: Home Office   I discussed the limitations of evaluation and management by telemedicine and the availability of in person appointments. The patient expressed understanding and agreed to proceed.    History of Present Illness: Kristine [name] is a 58 y.o. who identifies as a female who was assigned female at birth, and is being seen today for cough and congestion.  HPI: Cough This is a new problem. The current episode started in the past 7 days. The problem has been gradually worsening. The problem occurs every few minutes. The cough is Productive of brown sputum. Associated symptoms include a fever (subjective on day 1), myalgias and a sore throat (improved some today). Pertinent negatives include no chest pain, chills, ear congestion, ear pain, headaches, nasal congestion, postnasal drip, rhinorrhea, sweats or wheezing. Associated symptoms comments: Hoarse voice. The symptoms are aggravated by lying down. Treatments tried: theraflu. The treatment provided no relief. There is no history of asthma, bronchitis, COPD, environmental allergies or pneumonia.    Problems:  Patient Active Problem List   Diagnosis Date Noted   COVID-19 virus infection 02/05/2021   Carpal tunnel syndrome 10/20/2012   Nausea & vomiting 06/06/2011   Allergic rhinitis 06/06/2011   Acute sinusitis with symptoms greater than 10 days 06/06/2011   Tobacco user 06/06/2011   OVERACTIVE BLADDER 04/03/2010   LOW BACK PAIN, ACUTE 02/08/2010   BACK PAIN 02/08/2010   URINARY FREQUENCY, HX OF 02/08/2010   CIGARETTE SMOKER 07/25/2009  NAUSEA AND VOMITING 07/25/2009   ABDOMINAL PAIN, EPIGASTRIC 07/14/2009   VIRAL INFECTION 03/07/2008   SYNCOPE 03/01/2008   GASTROENTERITIS, ACUTE 01/04/2008   ACUTE BRONCHITIS 12/23/2007   SACROILIITIS 12/23/2007   HYPERLIPIDEMIA 07/15/2007   DEPRESSION 07/15/2007   Essential hypertension 07/15/2007   GERD 07/15/2007    MENOPAUSAL SYNDROME 07/15/2007    Allergies:  Allergies  Allergen Reactions   Ciprofloxacin  Hives   Codeine Itching   Lipitor [Atorvastatin  Calcium ] Other (See Comments)    Myalgias    Medications:  Current Outpatient Medications:    azithromycin  (ZITHROMAX ) 250 MG tablet, Take 2 tablets on day 1, then 1 tablet daily on days 2 through 5, Disp: 6 tablet, Rfl: 0   benzonatate (TESSALON) 100 MG capsule, Take 1-2 capsules (100-200 mg total) by mouth 3 (three) times daily as needed., Disp: 30 capsule, Rfl: 0   amLODipine -valsartan  (EXFORGE ) 5-160 MG tablet, TAKE 1 TABLET BY MOUTH EVERY DAY, Disp: 90 tablet, Rfl: 0   atorvastatin  (LIPITOR) 20 MG tablet, Take 1 tablet (20 mg total) by mouth daily., Disp: 90 tablet, Rfl: 3   Fesoterodine Fumarate (TOVIAZ PO), Take by mouth., Disp: , Rfl:    trimethoprim  (TRIMPEX ) 100 MG tablet, Take 100 mg by mouth daily., Disp: , Rfl:    valACYclovir  (VALTREX ) 1000 MG tablet, Take 1 tablet (1,000 mg total) by mouth 3 (three) times daily., Disp: 30 tablet, Rfl: 0   venlafaxine  XR (EFFEXOR -XR) 150 MG 24 hr capsule, Take 1 capsule (150 mg total) by mouth daily with breakfast., Disp: 90 capsule, Rfl: 3   Vibegron (GEMTESA) 75 MG TABS, Take by mouth once., Disp: , Rfl:   Observations/Objective: Patient is well-developed, well-nourished in no acute distress.  Resting comfortably at home.  Head is normocephalic, atraumatic.  No labored breathing.  Speech is clear and coherent with logical content.  Patient is alert and oriented at baseline.    Assessment and Plan: 1. Acute bacterial bronchitis (Primary) - azithromycin  (ZITHROMAX ) 250 MG tablet; Take 2 tablets on day 1, then 1 tablet daily on days 2 through 5  Dispense: 6 tablet; Refill: 0 - benzonatate (TESSALON) 100 MG capsule; Take 1-2 capsules (100-200 mg total) by mouth 3 (three) times daily as needed.  Dispense: 30 capsule; Refill: 0  - Worsening over a week despite OTC medications - Will treat with  Z-pack and tessalon perles - Can continue Mucinex  - Push fluids.  - Rest.  - Steam and humidifier can help - Seek in person evaluation if worsening or symptoms fail to improve    Follow Up Instructions: I discussed the assessment and treatment plan with the patient. The patient was provided an opportunity to ask questions and all were answered. The patient agreed with the plan and demonstrated an understanding of the instructions.  A copy of instructions were sent to the patient via MyChart unless otherwise noted below.    The patient was advised to call back or seek an in-person evaluation if the symptoms worsen or if the condition fails to improve as anticipated.    Kristine CHRISTELLA Dickinson, PA-C

## 2023-09-15 ENCOUNTER — Other Ambulatory Visit: Payer: Self-pay | Admitting: Family Medicine

## 2023-09-15 DIAGNOSIS — I10 Essential (primary) hypertension: Secondary | ICD-10-CM

## 2023-09-15 MED ORDER — AMLODIPINE BESYLATE-VALSARTAN 5-160 MG PO TABS: 1.0000 | ORAL_TABLET | Freq: Every day | ORAL | 0 refills | Status: AC

## 2023-09-15 MED ORDER — ATORVASTATIN CALCIUM 20 MG PO TABS: 20.0000 mg | ORAL_TABLET | Freq: Every day | ORAL | 3 refills | Status: AC

## 2023-09-15 NOTE — Telephone Encounter (Signed)
 Copied from CRM [id number]. Topic: Clinical - Medication Refill >> Sep 15, 2023  9:27 AM Macario HERO wrote: Medication: amLODipine -valsartan  (EXFORGE ) 5-160 MG tablet [[id number]] atorvastatin  (LIPITOR) 20 MG tablet [[id number]] trimethoprim  (TRIMPEX ) 100 MG tablet [[id number]]   Has the patient contacted their pharmacy? Yes (Agent: If no, request that the patient contact the pharmacy for the refill. If patient does not wish to contact the pharmacy document the reason why and proceed with request.) (Agent: If yes, when and what did the pharmacy advise?)  This is the patient's preferred pharmacy:  [id number] [place], [place] - [address] [place] [address] Phone: [phone] Fax: [phone]    Is this the correct pharmacy for this prescription? Yes If no, delete pharmacy and type the correct one.   Has the prescription been filled recently? Yes  Is the patient out of the medication? No, almost  Has the patient been seen for an appointment in the last year OR does the patient have an upcoming appointment? Yes  Can we respond through MyChart? Yes  Agent: Please be advised that Rx refills may take up to 3 business days. We ask that you follow-up with your pharmacy.

## 2023-09-18 ENCOUNTER — Ambulatory Visit (INDEPENDENT_AMBULATORY_CARE_PROVIDER_SITE_OTHER): Admitting: Family Medicine

## 2023-09-18 ENCOUNTER — Encounter: Payer: Self-pay | Admitting: Family Medicine

## 2023-09-18 VITALS — BP 128/82 | HR 71 | Temp 98.1°F | Wt 174.0 lb

## 2023-09-18 DIAGNOSIS — Z0000 Encounter for general adult medical examination without abnormal findings: Secondary | ICD-10-CM

## 2023-09-18 LAB — CBC WITH DIFFERENTIAL/PLATELET
Basophils Absolute: 0 K/uL (ref 0.0–0.1)
Basophils Relative: 0.8 % (ref 0.0–3.0)
Eosinophils Absolute: 0.1 K/uL (ref 0.0–0.7)
Eosinophils Relative: 3.7 % (ref 0.0–5.0)
HCT: 42.6 % (ref 36.0–46.0)
Hemoglobin: 13.5 g/dL (ref 12.0–15.0)
Lymphocytes Relative: 43.1 % (ref 12.0–46.0)
Lymphs Abs: 1.6 K/uL (ref 0.7–4.0)
MCHC: 31.8 g/dL (ref 30.0–36.0)
MCV: 92.9 fl (ref 78.0–100.0)
Monocytes Absolute: 0.5 K/uL (ref 0.1–1.0)
Monocytes Relative: 15.2 % — ABNORMAL HIGH (ref 3.0–12.0)
Neutro Abs: 1.3 K/uL — ABNORMAL LOW (ref 1.4–7.7)
Neutrophils Relative %: 37.2 % — ABNORMAL LOW (ref 43.0–77.0)
Platelets: 229 K/uL (ref 150.0–400.0)
RBC: 4.58 Mil/uL (ref 3.87–5.11)
RDW: 16 % — ABNORMAL HIGH (ref 11.5–15.5)
WBC: 3.6 K/uL — ABNORMAL LOW (ref 4.0–10.5)

## 2023-09-18 LAB — HEPATIC FUNCTION PANEL
ALT: 18 U/L (ref 0–35)
AST: 21 U/L (ref 0–37)
Albumin: 4.2 g/dL (ref 3.5–5.2)
Alkaline Phosphatase: 70 U/L (ref 39–117)
Bilirubin, Direct: 0.1 mg/dL (ref 0.0–0.3)
Total Bilirubin: 0.6 mg/dL (ref 0.2–1.2)
Total Protein: 7 g/dL (ref 6.0–8.3)

## 2023-09-18 LAB — LIPID PANEL
Cholesterol: 215 mg/dL — ABNORMAL HIGH (ref 0–200)
HDL: 67.8 mg/dL (ref >39.00)
LDL Cholesterol: 122 mg/dL — ABNORMAL HIGH (ref 0–99)
NonHDL: 147.18
Total CHOL/HDL Ratio: 3
Triglycerides: 125 mg/dL (ref 0.0–149.0)
VLDL: 25 mg/dL (ref 0.0–40.0)

## 2023-09-18 LAB — BASIC METABOLIC PANEL WITH GFR
BUN: 16 mg/dL (ref 6–23)
CO2: 33 meq/L — ABNORMAL HIGH (ref 19–32)
Calcium: 9.1 mg/dL (ref 8.4–10.5)
Chloride: 106 meq/L (ref 96–112)
Creatinine, Ser: 0.99 mg/dL (ref 0.40–1.20)
GFR: 62.29 mL/min (ref >60.00)
Glucose, Bld: 90 mg/dL (ref 70–99)
Potassium: 4.2 meq/L (ref 3.5–5.1)
Sodium: 145 meq/L (ref 135–145)

## 2023-09-18 LAB — HEMOGLOBIN A1C: Hgb A1c MFr Bld: 6.2 % (ref 4.6–6.5)

## 2023-09-18 LAB — TSH: TSH: 1.61 u[IU]/mL (ref 0.35–5.50)

## 2023-09-18 MED ORDER — VENLAFAXINE HCL ER 150 MG PO CP24: 150.0000 mg | ORAL_CAPSULE | Freq: Every day | ORAL | 3 refills | Status: AC

## 2023-09-18 NOTE — Progress Notes (Signed)
   Subjective:    Patient ID: Kristine [name], female    DOB: 01/16/1966, 58 y.o.   MRN: [id number]  HPI Here for a well exam. She feels fine.    Review of Systems  Constitutional: Negative.   HENT: Negative.    Eyes: Negative.   Respiratory: Negative.    Cardiovascular: Negative.   Gastrointestinal: Negative.   Genitourinary:  Negative for decreased urine volume, difficulty urinating, dyspareunia, dysuria, enuresis, flank pain, frequency, hematuria, pelvic pain and urgency.  Musculoskeletal: Negative.   Skin: Negative.   Neurological: Negative.  Negative for headaches.  Psychiatric/Behavioral: Negative.         Objective:   Physical Exam Constitutional:      General: She is not in acute distress.    Appearance: Normal appearance. She is well-developed.  HENT:     Head: Normocephalic and atraumatic.     Right Ear: External ear normal.     Left Ear: External ear normal.     Nose: Nose normal.     Mouth/Throat:     Pharynx: No oropharyngeal exudate.  Eyes:     General: No scleral icterus.    Conjunctiva/sclera: Conjunctivae normal.     Pupils: Pupils are equal, round, and reactive to light.  Neck:     Thyroid : No thyromegaly.     Vascular: No JVD.  Cardiovascular:     Rate and Rhythm: Normal rate and regular rhythm.     Pulses: Normal pulses.     Heart sounds: Normal heart sounds. No murmur heard.    No friction rub. No gallop.  Pulmonary:     Effort: Pulmonary effort is normal. No respiratory distress.     Breath sounds: Normal breath sounds. No wheezing or rales.  Chest:     Chest wall: No tenderness.  Abdominal:     General: Bowel sounds are normal. There is no distension.     Palpations: Abdomen is soft. There is no mass.     Tenderness: There is no abdominal tenderness. There is no guarding or rebound.  Musculoskeletal:        General: No tenderness. Normal range of motion.     Cervical back: Normal range of motion and neck supple.  Lymphadenopathy:      Cervical: No cervical adenopathy.  Skin:    General: Skin is warm and dry.     Findings: No erythema or rash.  Neurological:     General: No focal deficit present.     Mental Status: She is alert and oriented to person, place, and time.     Cranial Nerves: No cranial nerve deficit.     Motor: No abnormal muscle tone.     Coordination: Coordination normal.     Deep Tendon Reflexes: Reflexes are normal and symmetric. Reflexes normal.  Psychiatric:        Mood and Affect: Mood normal.        Behavior: Behavior normal.        Thought Content: Thought content normal.        Judgment: Judgment normal.           Assessment & Plan:  Well exam. We discussed diet and exercise. Get fasting labs. She is due for another colonoscopy so we will have the GI office contact her. She will also set up a GYN exam and a mammogram. Garnette Olmsted, MD

## 2023-09-19 ENCOUNTER — Ambulatory Visit: Payer: Self-pay | Admitting: Family Medicine

## 2024-03-11 ENCOUNTER — Telehealth: Payer: Self-pay

## 2024-03-11 NOTE — Telephone Encounter (Signed)
 Called and spoke with patient- patient advised of need for colon recall- patient agreeable to plan and ha been scheduled for a PV and her colonoscopy; Patient advised to call back to the office at [phone] should questions/concerns arise;  Patient verbalized understanding of information/instructions;

## 2024-03-22 ENCOUNTER — Encounter

## 2024-04-05 ENCOUNTER — Encounter

## 2024-04-23 ENCOUNTER — Encounter: Admitting: Gastroenterology
